# Patient Record
Sex: Female | Born: 1945
Health system: Southern US, Community
[De-identification: ages and names within clinical notes are randomized; demographics above are authoritative.]

## PROBLEM LIST (undated history)

## (undated) DIAGNOSIS — J84112 Idiopathic pulmonary fibrosis: Principal | ICD-10-CM

## (undated) DIAGNOSIS — R053 Chronic cough: Secondary | ICD-10-CM

## (undated) DIAGNOSIS — F329 Major depressive disorder, single episode, unspecified: Secondary | ICD-10-CM

## (undated) DIAGNOSIS — I639 Cerebral infarction, unspecified: Secondary | ICD-10-CM

## (undated) DIAGNOSIS — K219 Gastro-esophageal reflux disease without esophagitis: Secondary | ICD-10-CM

## (undated) DIAGNOSIS — F32A Depression, unspecified: Secondary | ICD-10-CM

## (undated) DIAGNOSIS — F439 Reaction to severe stress, unspecified: Secondary | ICD-10-CM

## (undated) DIAGNOSIS — G459 Transient cerebral ischemic attack, unspecified: Secondary | ICD-10-CM

---

## 2001-04-27 ENCOUNTER — Encounter: Admission: RE | Admit: 2001-04-27 | Discharge: 2001-04-27 | Payer: Self-pay | Admitting: Obstetrics and Gynecology

## 2001-04-27 ENCOUNTER — Encounter: Payer: Self-pay | Admitting: Obstetrics and Gynecology

## 2001-07-02 ENCOUNTER — Encounter (INDEPENDENT_AMBULATORY_CARE_PROVIDER_SITE_OTHER): Payer: Self-pay | Admitting: *Deleted

## 2001-07-02 ENCOUNTER — Ambulatory Visit (HOSPITAL_COMMUNITY): Admission: RE | Admit: 2001-07-02 | Discharge: 2001-07-02 | Payer: Self-pay | Admitting: Gastroenterology

## 2002-05-10 ENCOUNTER — Encounter: Payer: Self-pay | Admitting: Gynecology

## 2002-05-10 ENCOUNTER — Other Ambulatory Visit: Admission: RE | Admit: 2002-05-10 | Discharge: 2002-05-10 | Payer: Self-pay | Admitting: Gynecology

## 2002-05-10 ENCOUNTER — Encounter: Admission: RE | Admit: 2002-05-10 | Discharge: 2002-05-10 | Payer: Self-pay | Admitting: Gynecology

## 2003-05-18 ENCOUNTER — Encounter: Admission: RE | Admit: 2003-05-18 | Discharge: 2003-05-18 | Payer: Self-pay | Admitting: Gynecology

## 2003-05-18 ENCOUNTER — Other Ambulatory Visit: Admission: RE | Admit: 2003-05-18 | Discharge: 2003-05-18 | Payer: Self-pay | Admitting: Gynecology

## 2003-05-18 ENCOUNTER — Encounter: Payer: Self-pay | Admitting: Gynecology

## 2004-05-21 ENCOUNTER — Encounter: Admission: RE | Admit: 2004-05-21 | Discharge: 2004-05-21 | Payer: Self-pay | Admitting: Gynecology

## 2004-05-21 ENCOUNTER — Other Ambulatory Visit: Admission: RE | Admit: 2004-05-21 | Discharge: 2004-05-21 | Payer: Self-pay | Admitting: Gynecology

## 2005-04-04 ENCOUNTER — Encounter: Admission: RE | Admit: 2005-04-04 | Discharge: 2005-04-04 | Payer: Self-pay | Admitting: Family Medicine

## 2005-04-09 ENCOUNTER — Encounter: Admission: RE | Admit: 2005-04-09 | Discharge: 2005-04-09 | Payer: Self-pay | Admitting: Family Medicine

## 2005-05-22 ENCOUNTER — Other Ambulatory Visit: Admission: RE | Admit: 2005-05-22 | Discharge: 2005-05-22 | Payer: Self-pay | Admitting: Gynecology

## 2005-05-22 ENCOUNTER — Encounter: Admission: RE | Admit: 2005-05-22 | Discharge: 2005-05-22 | Payer: Self-pay | Admitting: Gynecology

## 2005-09-04 ENCOUNTER — Encounter (INDEPENDENT_AMBULATORY_CARE_PROVIDER_SITE_OTHER): Payer: Self-pay | Admitting: *Deleted

## 2005-09-04 ENCOUNTER — Ambulatory Visit (HOSPITAL_COMMUNITY): Admission: RE | Admit: 2005-09-04 | Discharge: 2005-09-04 | Payer: Self-pay | Admitting: Gynecology

## 2005-09-04 ENCOUNTER — Ambulatory Visit (HOSPITAL_BASED_OUTPATIENT_CLINIC_OR_DEPARTMENT_OTHER): Admission: RE | Admit: 2005-09-04 | Discharge: 2005-09-04 | Payer: Self-pay | Admitting: Gynecology

## 2006-05-23 ENCOUNTER — Encounter: Admission: RE | Admit: 2006-05-23 | Discharge: 2006-05-23 | Payer: Self-pay | Admitting: Gynecology

## 2007-01-13 ENCOUNTER — Other Ambulatory Visit: Admission: RE | Admit: 2007-01-13 | Discharge: 2007-01-13 | Payer: Self-pay | Admitting: Gynecology

## 2007-05-26 ENCOUNTER — Encounter: Admission: RE | Admit: 2007-05-26 | Discharge: 2007-05-26 | Payer: Self-pay | Admitting: Gynecology

## 2008-05-31 ENCOUNTER — Encounter: Admission: RE | Admit: 2008-05-31 | Discharge: 2008-05-31 | Payer: Self-pay | Admitting: Gynecology

## 2008-06-02 ENCOUNTER — Encounter: Admission: RE | Admit: 2008-06-02 | Discharge: 2008-06-02 | Payer: Self-pay | Admitting: Family Medicine

## 2008-11-25 HISTORY — PX: BREAST EXCISIONAL BIOPSY: SUR124

## 2009-01-03 ENCOUNTER — Encounter: Admission: RE | Admit: 2009-01-03 | Discharge: 2009-01-03 | Payer: Self-pay | Admitting: Family Medicine

## 2009-06-02 ENCOUNTER — Encounter: Admission: RE | Admit: 2009-06-02 | Discharge: 2009-06-02 | Payer: Self-pay | Admitting: Gynecology

## 2009-06-07 ENCOUNTER — Encounter: Admission: RE | Admit: 2009-06-07 | Discharge: 2009-06-07 | Payer: Self-pay | Admitting: Gynecology

## 2009-06-07 ENCOUNTER — Encounter (INDEPENDENT_AMBULATORY_CARE_PROVIDER_SITE_OTHER): Payer: Self-pay | Admitting: Gynecology

## 2009-07-05 ENCOUNTER — Encounter (INDEPENDENT_AMBULATORY_CARE_PROVIDER_SITE_OTHER): Payer: Self-pay | Admitting: Surgery

## 2009-07-06 ENCOUNTER — Ambulatory Visit (HOSPITAL_BASED_OUTPATIENT_CLINIC_OR_DEPARTMENT_OTHER): Admission: RE | Admit: 2009-07-06 | Discharge: 2009-07-06 | Payer: Self-pay | Admitting: Surgery

## 2010-06-08 ENCOUNTER — Encounter: Admission: RE | Admit: 2010-06-08 | Discharge: 2010-06-08 | Payer: Self-pay | Admitting: Gynecology

## 2011-03-03 LAB — BASIC METABOLIC PANEL
BUN: 8 mg/dL (ref 6–23)
Calcium: 9.2 mg/dL (ref 8.4–10.5)
Creatinine, Ser: 0.74 mg/dL (ref 0.4–1.2)
GFR calc Af Amer: >60 mL/min (ref >60)
GFR calc non Af Amer: >60 mL/min (ref >60)
Glucose, Bld: 93 mg/dL (ref 70–99)
Sodium: 139 mEq/L (ref 135–145)

## 2011-04-09 NOTE — Op Note (Signed)
NAMESALENA, ORTLIEB             ACCOUNT NO.:  [id number]   MEDICAL RECORD NO.:  [id number]          PATIENT TYPE:  AMB   LOCATION:  [facility]                          FACILITY:  [facility]   PHYSICIAN:  Currie Paris, M.D.DATE OF BIRTH:  [birth year]   DATE OF PROCEDURE:  07/06/2009  DATE OF DISCHARGE:                               OPERATIVE REPORT   PREOPERATIVE DIAGNOSIS:  Mass, left breast, subareolar.  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   POSTOPERATIVE DIAGNOSIS:  Mass, left breast, subareolar.  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   OPERATION:  Excision of mass, left breast.   SURGEON:  Currie Paris, MD   ANESTHESIA:  MAC.   CLINICAL HISTORY:  This is a 65 year old lady with a palpable small  subareolar mass in the left breast.  A biopsy showed a sclerosing  papilloma.  It was decided to do a complete excision to be sure there  was no underlying malignancy.   DESCRIPTION OF PROCEDURE:  I saw the patient in the holding area and we  confirmed the plans as noted above.  We both initialed the left side as  the operative side.   The patient was taken to the operating room and after satisfactory IV  sedation, the left breast was prepped and draped.  The time-out was  done.   A combination of 1% Xylocaine with epinephrine and 0.5% plain Marcaine  was used for local.  I infiltrated the area of the mass.  I then made a  curvilinear incision at the areolar margin.  I divided a little bit of  fatty tissue just medial to the mass until I thought I was deep to the  mass and then came around superiorly and inferiorly.   The mass in question appeared to be attached to the subareolar tissue,  so I used a cautery and divided off the fat.  I saw least one dilated  duct that looked like it had some bloody fluid in it.  I got the area  completely out.  There was no palpable residual abnormality.   I put more local in.  I made sure everything was dry.  I closed with 3-0  Vicryl, 4-0 Monocryl subcuticular, and Dermabond.   Although the mass was palpable, I went ahead and took the specimen  mammogram and could see that the radiographically placed clip had been  excised, confirming the area that had been biopsied was removed.   The patient tolerated the procedure well and there were no  complications.  All counts were correct.      Currie Paris, M.D.  Electronically Signed     CJS/MEDQ  D:  07/06/2009  T:  07/07/2009  Job:  [id number]   cc:   Gretta Arab. Valentina Lucks, M.D.  [facility]

## 2011-04-12 NOTE — Procedures (Signed)
[place]  Patient:    Ashley Buck, Ashley Buck                    MRN: [id number] Proc. Date: 07/02/01 Adm. Date:  [id number] Attending:  Louie Bun CC:         Esmeralda Arthur, M.D.   Procedure Report  PROCEDURE PERFORMED:  Colonoscopy.  ENDOSCOPIST:  Everardo All. Madilyn Fireman, M.D.  INDICATIONS FOR PROCEDURE:  Recent onset of intermittent small volume hematochezia in a 65 year old patient with no prior colon screening.  DESCRIPTION OF PROCEDURE:  The patient was placed in the left lateral decubitus position and placed on the pulse monitor with continuous low flow oxygen delivered by nasal cannula.  She was sedated with 50 mg IV Demerol and 7.5 mg IV Versed.  The Olympus video colonoscope was inserted into the rectum and advanced to the cecum, confirmed by terminal ileum McBurneys point and visualization of the ileocecal valve and appendiceal orifice.  The prep was excellent.  The cecum, ascending, transverse, descending and sigmoid colon all appeared normal with no masses, polyps, diverticula or other mucosal abnormalities.  The rectum likewise appeared normal and retroflex view of the anus revealed only small nonthrombosed internal hemorrhoids.  The colonoscope was then  withdrawn and the patient returned to the recovery room in stable condition.  The patient tolerated the procedure well and there were no immediate complications.  IMPRESSION:  Small internal hemorrhoids, otherwise normal colonoscopy. DD:  07/02/01 TD:  07/02/01 Job: [id number]

## 2011-04-12 NOTE — Op Note (Signed)
NAME:  Ashley Buck, Ashley Buck             ACCOUNT NO.:  [id number]   MEDICAL RECORD NO.:  [id number]          PATIENT TYPE:  AMB   LOCATION:  [facility]                         FACILITY:  [facility]   PHYSICIAN:  Gretta Cool, M.D. DATE OF BIRTH:  [birth year]   DATE OF PROCEDURE:  09/04/2005  DATE OF DISCHARGE:                                 OPERATIVE REPORT   PREOPERATIVE DIAGNOSES:  Abnormal uterine bleeding with polyp by ultrasound,  portions of polyp on previous endometrial sampling benign tissue.   POSTOPERATIVE DIAGNOSES:  Abnormal uterine bleeding with polyp by  ultrasound, portions of polyp on previous endometrial sampling benign  tissue.   PROCEDURE:  Hysteroscopy, resection total for ablation plus VaporTrode.   SURGEON:  Gretta Cool, M.D.   ANESTHESIA:  IV sedation, paracervical block.   DESCRIPTION OF PROCEDURE:  Under excellent anesthesia as above with the  patient prepped and draped in Allen stirrups, the cervix was pulled down  into view then progressively dilated with a series of Pratt dilators to  accommodate the 7 mm resectoscope. Once the cavity was photographed and  examined, the entire endometrium was resected down 5 mm or more into the  myometrium. The cornual areas were treated by touch technique after  resection superficially. The entire endometrial cavity then was treated by  VaporTrode at 200 watts pure cut so as to eliminate any islands of  superficial endometrium out into the myometrium. At this point, there was no  significant bleeding at reduced pressure. The procedure was terminated  without complications. The patient returned to the recovery room in  excellent condition.           ______________________________  Gretta Cool, M.D.     CWL/MEDQ  D:  09/04/2005  T:  09/04/2005  Job:  [id number]   cc:   Gretta Arab. Valentina Lucks, M.D.  Fax: [phone]

## 2011-05-22 ENCOUNTER — Other Ambulatory Visit: Payer: Self-pay | Admitting: Gynecology

## 2011-05-22 DIAGNOSIS — Z1231 Encounter for screening mammogram for malignant neoplasm of breast: Secondary | ICD-10-CM

## 2011-06-17 ENCOUNTER — Ambulatory Visit
Admission: RE | Admit: 2011-06-17 | Discharge: 2011-06-17 | Disposition: A | Discharge status: Home / Self Care | Payer: Medicare Other | Source: Ambulatory Visit | Attending: Gynecology | Admitting: Gynecology

## 2011-06-17 DIAGNOSIS — Z1231 Encounter for screening mammogram for malignant neoplasm of breast: Secondary | ICD-10-CM

## 2011-07-15 ENCOUNTER — Other Ambulatory Visit: Payer: Self-pay | Admitting: Gynecology

## 2012-07-01 ENCOUNTER — Other Ambulatory Visit: Payer: Self-pay | Admitting: Gynecology

## 2012-07-01 DIAGNOSIS — Z1231 Encounter for screening mammogram for malignant neoplasm of breast: Secondary | ICD-10-CM

## 2012-07-17 ENCOUNTER — Ambulatory Visit
Admission: RE | Admit: 2012-07-17 | Discharge: 2012-07-17 | Disposition: A | Discharge status: Home / Self Care | Payer: Medicare Other | Source: Ambulatory Visit | Attending: Gynecology | Admitting: Gynecology

## 2012-07-17 DIAGNOSIS — Z1231 Encounter for screening mammogram for malignant neoplasm of breast: Secondary | ICD-10-CM

## 2012-07-21 ENCOUNTER — Other Ambulatory Visit: Payer: Self-pay | Admitting: Gynecology

## 2013-02-10 ENCOUNTER — Other Ambulatory Visit: Payer: Self-pay | Admitting: Dermatology

## 2013-06-29 ENCOUNTER — Other Ambulatory Visit: Payer: Self-pay

## 2013-06-29 DIAGNOSIS — Z1231 Encounter for screening mammogram for malignant neoplasm of breast: Secondary | ICD-10-CM

## 2013-07-20 ENCOUNTER — Ambulatory Visit
Admission: RE | Admit: 2013-07-20 | Discharge: 2013-07-20 | Disposition: A | Discharge status: Home / Self Care | Payer: Self-pay | Source: Ambulatory Visit

## 2013-07-20 DIAGNOSIS — Z1231 Encounter for screening mammogram for malignant neoplasm of breast: Secondary | ICD-10-CM

## 2014-02-23 ENCOUNTER — Other Ambulatory Visit: Payer: Self-pay | Admitting: Dermatology

## 2014-08-19 ENCOUNTER — Other Ambulatory Visit: Payer: Self-pay

## 2014-08-19 DIAGNOSIS — Z1231 Encounter for screening mammogram for malignant neoplasm of breast: Secondary | ICD-10-CM

## 2014-09-08 ENCOUNTER — Ambulatory Visit: Payer: Medicare Other

## 2014-09-21 ENCOUNTER — Ambulatory Visit
Admission: RE | Admit: 2014-09-21 | Discharge: 2014-09-21 | Disposition: A | Discharge status: Home / Self Care | Payer: Self-pay | Source: Ambulatory Visit

## 2014-09-21 DIAGNOSIS — Z1231 Encounter for screening mammogram for malignant neoplasm of breast: Secondary | ICD-10-CM

## 2015-10-24 ENCOUNTER — Ambulatory Visit (INDEPENDENT_AMBULATORY_CARE_PROVIDER_SITE_OTHER): Payer: Medicare Other | Admitting: Podiatry

## 2015-10-24 ENCOUNTER — Encounter: Payer: Self-pay | Admitting: Podiatry

## 2015-10-24 VITALS — BP 178/84 | HR 72 | Ht 63.75 in | Wt 168.0 lb

## 2015-10-24 DIAGNOSIS — B351 Tinea unguium: Secondary | ICD-10-CM | POA: Diagnosis not present

## 2015-10-24 DIAGNOSIS — L600 Ingrowing nail: Secondary | ICD-10-CM | POA: Diagnosis not present

## 2015-10-24 DIAGNOSIS — M79606 Pain in leg, unspecified: Secondary | ICD-10-CM | POA: Insufficient documentation

## 2015-10-24 DIAGNOSIS — M79673 Pain in unspecified foot: Secondary | ICD-10-CM | POA: Diagnosis not present

## 2015-10-24 DIAGNOSIS — Q828 Other specified congenital malformations of skin: Secondary | ICD-10-CM

## 2015-10-24 DIAGNOSIS — M79605 Pain in left leg: Secondary | ICD-10-CM

## 2015-10-24 NOTE — Patient Instructions (Signed)
Seen for painful corns, calluses and ingrown nail. All debrided. Return as needed.

## 2015-10-24 NOTE — Progress Notes (Signed)
SUBJECTIVE: 69 y.o. year old female presents for painful corns, calluses, and ingrown toe nails.   REVIEW OF SYSTEMS: Constitutional: negative Eyes: negative Ears, nose, mouth, throat, and face: negative Respiratory: negative Cardiovascular: negative Gastrointestinal: negative Genitourinary:negative Hematologic/lymphatic: negative Neurological: Under care for anxiety problem. Allergic/Immunologic: negative Anxiety problem.  OBJECTIVE: DERMATOLOGIC EXAMINATION: Nails: Painful ingrown nail left great toe medial border without drainage. Thick painful corns at distal lateral and medial aspect of the 5th digits bilateral. Multiple plantar porokeratotic lesions under the balls of both feet.  VASCULAR EXAMINATION OF LOWER LIMBS: Pedal pulses: All pedal pulses are palpable with normal pulsation.  Temperature gradient from tibial crest to dorsum of foot is within normal bilateral. NEUROLOGIC EXAMINATION OF THE LOWER LIMBS: All epicritic and tactile sensations grossly intact. MUSCULOSKELETAL EXAMINATION: Enlarged bone distal end 5th digit bilateral.   ASSESSMENT: Multiple porokeratosis painful bilateral. Ingrown nail left great toe medial border left. Onychomycosis both great toe nails.   PLAN: Reviewed findings. Debrided all nails, corns, and calluses.

## 2015-11-22 ENCOUNTER — Other Ambulatory Visit: Payer: Self-pay

## 2015-11-22 DIAGNOSIS — Z1231 Encounter for screening mammogram for malignant neoplasm of breast: Secondary | ICD-10-CM

## 2015-12-19 ENCOUNTER — Ambulatory Visit
Admission: RE | Admit: 2015-12-19 | Discharge: 2015-12-19 | Disposition: A | Discharge status: Home / Self Care | Payer: Medicare Other | Source: Ambulatory Visit

## 2015-12-19 DIAGNOSIS — Z1231 Encounter for screening mammogram for malignant neoplasm of breast: Secondary | ICD-10-CM

## 2016-11-20 ENCOUNTER — Other Ambulatory Visit: Payer: Self-pay | Admitting: Family Medicine

## 2016-11-20 DIAGNOSIS — Z1231 Encounter for screening mammogram for malignant neoplasm of breast: Secondary | ICD-10-CM

## 2016-12-19 ENCOUNTER — Ambulatory Visit: Payer: Medicare Other

## 2017-01-22 ENCOUNTER — Ambulatory Visit
Admission: RE | Admit: 2017-01-22 | Discharge: 2017-01-22 | Disposition: A | Discharge status: Home / Self Care | Payer: Medicare Other | Source: Ambulatory Visit | Attending: Family Medicine | Admitting: Family Medicine

## 2017-01-22 DIAGNOSIS — Z1231 Encounter for screening mammogram for malignant neoplasm of breast: Secondary | ICD-10-CM

## 2017-02-25 ENCOUNTER — Ambulatory Visit (INDEPENDENT_AMBULATORY_CARE_PROVIDER_SITE_OTHER): Payer: Medicare Other | Admitting: Podiatry

## 2017-02-25 ENCOUNTER — Ambulatory Visit: Payer: Medicare Other

## 2017-02-25 ENCOUNTER — Encounter: Payer: Self-pay | Admitting: Podiatry

## 2017-02-25 ENCOUNTER — Ambulatory Visit (INDEPENDENT_AMBULATORY_CARE_PROVIDER_SITE_OTHER): Payer: Medicare Other

## 2017-02-25 VITALS — BP 156/78 | HR 72 | Resp 16 | Ht 66.0 in | Wt 150.0 lb

## 2017-02-25 DIAGNOSIS — L600 Ingrowing nail: Secondary | ICD-10-CM

## 2017-02-25 DIAGNOSIS — L989 Disorder of the skin and subcutaneous tissue, unspecified: Secondary | ICD-10-CM | POA: Diagnosis not present

## 2017-02-25 DIAGNOSIS — M79671 Pain in right foot: Secondary | ICD-10-CM

## 2017-02-25 DIAGNOSIS — M79672 Pain in left foot: Principal | ICD-10-CM

## 2017-02-25 NOTE — Progress Notes (Signed)
   Subjective:    Patient ID: Ashley Buck, female    DOB: 09/15/1946, 71 y.o.   MRN: [id number]  HPI Chief Complaint  Patient presents with  . Painful lesion    Bilateral; 4th toes-lateral side; x2 yrs      Review of Systems  All other systems reviewed and are negative.      Objective:   Physical Exam        Assessment & Plan:

## 2017-02-25 NOTE — Patient Instructions (Signed)
Pre-Operative Instructions  Congratulations, you have decided to take an important step to improving your quality of life.  You can be assured that the doctors of [facility] will be with you every step of the way.  1. Plan to be at the surgery center/hospital at least 1 (one) hour prior to your scheduled time unless otherwise directed by the surgical center/hospital staff.  You must have a responsible adult accompany you, remain during the surgery and drive you home.  Make sure you have directions to the surgical center/hospital and know how to get there on time. 2. For hospital based surgery you will need to obtain a history and physical form from your family physician within 1 month prior to the date of surgery- we will give you a form for you primary physician.  3. We make every effort to accommodate the date you request for surgery.  There are however, times where surgery dates or times have to be moved.  We will contact you as soon as possible if a change in schedule is required.   4. No Aspirin/Ibuprofen for one week before surgery.  If you are on aspirin, any non-steroidal anti-inflammatory medications (Mobic, Aleve, Ibuprofen) you should stop taking it 7 days prior to your surgery.  You make take Tylenol  For pain prior to surgery.  5. Medications- If you are taking daily heart and blood pressure medications, seizure, reflux, allergy, asthma, anxiety, pain or diabetes medications, make sure the surgery center/hospital is aware before the day of surgery so they may notify you which medications to take or avoid the day of surgery. 6. No food or drink after midnight the night before surgery unless directed otherwise by surgical center/hospital staff. 7. No alcoholic beverages 24 hours prior to surgery.  No smoking 24 hours prior to or 24 hours after surgery. 8. Wear loose pants or shorts- loose enough to fit over bandages, boots, and casts. 9. No slip on shoes, sneakers are best. 10. Bring  your boot with you to the surgery center/hospital.  Also bring crutches or a walker if your physician has prescribed it for you.  If you do not have this equipment, it will be provided for you after surgery. 11. If you have not been contracted by the surgery center/hospital by the day before your surgery, call to confirm the date and time of your surgery. 12. Leave-time from work may vary depending on the type of surgery you have.  Appropriate arrangements should be made prior to surgery with your employer. 13. Prescriptions will be provided immediately following surgery by your doctor.  Have these filled as soon as possible after surgery and take the medication as directed. 14. Remove nail polish on the operative foot. 15. Wash the night before surgery.  The night before surgery wash the foot and leg well with the antibacterial soap provided and water paying special attention to beneath the toenails and in between the toes.  Rinse thoroughly with water and dry well with a towel.  Perform this wash unless told not to do so by your physician.  Enclosed: 1 Ice pack (please put in freezer the night before surgery)   1 Hibiclens skin cleaner   Pre-op Instructions  If you have any questions regarding the instructions, do not hesitate to call our office.  [place]: [address] [place], [place] [address] [phone]  [place]: [address] [place], [place] [address] [phone]  [place]: [address]  [place], [place] [address] [phone]   Dr.   Vivyan Biggers DPM, Dr. Matthew Wagoner DPM, Dr. M. Todd Hyatt DPM, Dr. Titorya Stover DPM 

## 2017-02-26 ENCOUNTER — Telehealth: Payer: Self-pay | Admitting: *Deleted

## 2017-02-26 NOTE — Telephone Encounter (Signed)
"  I'm calling on behalf of Ashley Buck.  She called [place] about an upcoming surgery.  She did not have the codes to provide [place] with so I told her I would call you to get the information.  I need the diagnosis codes and diagnosis codes please."  She is having Hammer Toe Repair 4,5 b/l ([id number])and Exostectomy [phone]).  "Okay, thank you so much."

## 2017-03-03 NOTE — Progress Notes (Signed)
Subjective:     Patient ID: Ashley Buck, female   DOB: 09-02-1946, 71 y.o.   MRN: [id number]  HPI patient presents stating that she has a lot of pain in her fifth toes of both feet fourth toes of both feet and on the inner side of the fifth toes of both feet. Patient states that this is been going on a long time and that she's tried to trim them she's tried to pad them and she's tried other modalities without relief of symptoms and they gradually becoming more discomforting and she cannot wear any form shoe gear comfortably. States it's been present for around a year   Review of Systems  All other systems reviewed and are negative.      Objective:   Physical Exam  Constitutional: She is oriented to person, place, and time.  Cardiovascular: Intact distal pulses.   Musculoskeletal: Normal range of motion.  Neurological: She is oriented to person, place, and time.  Skin: Skin is warm.  Nursing note and vitals reviewed.  neurovascular status intact muscle strength adequate range of motion within normal limits with patient found to have significant distal rotation digit 5 bilateral with distal lateral keratotic lesions that are very painful when pressed and keratotic lesions on the head of proximal hallux fourth toe both feet that are sore. Patient does have upon gait a significant rotational deformity of the fifth digit bilateral     Assessment:     Chronic lesion secondary to foot structure and digital structure with distal lateral keratotic lesions fifth digit bilateral    Plan:     H&P and conditions reviewed. At this point I do think it would be best due to her failure to respond to previous treatments of trimming padding and shoe gear modifications to consider surgical intervention. I reviewed that with her and this is what she wants and at this time I allowed her to read consent form going over distal arthroplasties distal lateral exostectomy and arthroplasty digit for both feet.  I reviewed alternative treatments complications and patient wants surgery and after extensive review signs consent form understanding no guarantee that this will get rid of the lesions permanently and that all complications are possible. She understands recovery can be 6 months to one year and signs consent form and is encouraged to call with any questions prior to surgery  X-rays indicate that there is significant rotation of the fifth digit bilateral pressing against the fourth toe bilateral with keratotic tissue formation

## 2017-03-18 ENCOUNTER — Encounter: Payer: Self-pay | Admitting: Podiatry

## 2017-03-18 DIAGNOSIS — M2042 Other hammer toe(s) (acquired), left foot: Secondary | ICD-10-CM | POA: Diagnosis not present

## 2017-03-18 DIAGNOSIS — M2041 Other hammer toe(s) (acquired), right foot: Secondary | ICD-10-CM

## 2017-03-18 DIAGNOSIS — M25774 Osteophyte, right foot: Secondary | ICD-10-CM | POA: Diagnosis not present

## 2017-03-18 DIAGNOSIS — M25775 Osteophyte, left foot: Secondary | ICD-10-CM

## 2017-03-26 ENCOUNTER — Ambulatory Visit (INDEPENDENT_AMBULATORY_CARE_PROVIDER_SITE_OTHER): Payer: Medicare Other

## 2017-03-26 ENCOUNTER — Encounter: Payer: Self-pay | Admitting: Podiatry

## 2017-03-26 ENCOUNTER — Ambulatory Visit (INDEPENDENT_AMBULATORY_CARE_PROVIDER_SITE_OTHER): Payer: Self-pay | Admitting: Podiatry

## 2017-03-26 VITALS — Resp 16

## 2017-03-26 DIAGNOSIS — M2042 Other hammer toe(s) (acquired), left foot: Secondary | ICD-10-CM

## 2017-03-26 DIAGNOSIS — M2041 Other hammer toe(s) (acquired), right foot: Secondary | ICD-10-CM

## 2017-03-26 NOTE — Progress Notes (Signed)
Subjective:    Patient ID: Ashley Buck, female   DOB: 71 y.o.   MRN: [id number]   HPI patient states she's doing well with toes in good alignment and states she's having minimal discomfort    ROS      Objective:  Physical Exam Neurovascular status intact negative Homans sign was noted with fourth and fifth digits good alignment wound edges well coapted with no drainage or other pathology noted and mild swelling    Assessment:  Doing well post arthroplasty digit 5 bilateral digits 4 bilateral and inner side digit 5 bilateral       Plan:     H&P conditions reviewed and reapplied dressings to the toes advised on wider shoes to be continued and Darco shoes and continued reduced activity elevation. Reappoint for [place] to recheck   X-rays revealed satisfactory resection of bone fourth and fifth digits with good alignment and patient will be seen back 2 weeks suture removal or earlier if any issues should occur

## 2017-04-03 NOTE — Progress Notes (Signed)
derotational arterioplasty 5th toe both feet: arthroplasty 4th toe both feet: exostectomy 5th toe both feet

## 2017-04-08 ENCOUNTER — Ambulatory Visit (INDEPENDENT_AMBULATORY_CARE_PROVIDER_SITE_OTHER): Payer: Self-pay | Admitting: Podiatry

## 2017-04-08 DIAGNOSIS — M2041 Other hammer toe(s) (acquired), right foot: Secondary | ICD-10-CM

## 2017-04-08 DIAGNOSIS — M2042 Other hammer toe(s) (acquired), left foot: Secondary | ICD-10-CM

## 2017-04-09 ENCOUNTER — Encounter: Payer: Medicare Other | Admitting: Podiatry

## 2017-04-15 NOTE — Progress Notes (Signed)
Patient presents status post derotational arterioplasty 5th toe both feet: arthroplasty 4th toe both feet: exostectomy 5th toe both feet stating that her feet are feeling good and she is no in a lot of pain   Surgical sites appear to be healing well, incision area healing well, aligned and approximated. No redness or drainage noted. Mild swelling but WNL limits  Sutures were removed and areas remained intact. Advised of s/s of infection. She is to follow up in 2 weeks or sooner if problem arise

## 2017-04-23 ENCOUNTER — Ambulatory Visit (INDEPENDENT_AMBULATORY_CARE_PROVIDER_SITE_OTHER): Payer: Medicare Other

## 2017-04-23 ENCOUNTER — Ambulatory Visit (INDEPENDENT_AMBULATORY_CARE_PROVIDER_SITE_OTHER): Payer: Self-pay | Admitting: Podiatry

## 2017-04-23 DIAGNOSIS — M2042 Other hammer toe(s) (acquired), left foot: Secondary | ICD-10-CM

## 2017-04-23 DIAGNOSIS — M2041 Other hammer toe(s) (acquired), right foot: Secondary | ICD-10-CM

## 2017-04-23 NOTE — Progress Notes (Signed)
Subjective:    Patient ID: Elliot CousinJeanette S Seelinger, female   DOB: 71 y.o.   MRN: [id number]   HPI patient states she's doing very well with surgery    ROS      Objective:  Physical Exam Neurovascular status intact negative Homan sign was noted with patient found to have good digital position digits 45 of both feet and outside of fifth digit both feet    Assessment:    Doing well with digital surgery for an 5 bilateral with exostectomy     Plan:    Stitches removed wound edges well coapted and advised on gradual return to soft shoes and increased activity  X-rays indicate satisfactory section of bone with good alignment of the fourth and fifth toes noted

## 2018-01-02 ENCOUNTER — Other Ambulatory Visit: Payer: Self-pay | Admitting: Family Medicine

## 2018-01-02 DIAGNOSIS — Z1231 Encounter for screening mammogram for malignant neoplasm of breast: Secondary | ICD-10-CM

## 2018-01-23 ENCOUNTER — Ambulatory Visit: Payer: Medicare Other

## 2018-01-29 ENCOUNTER — Ambulatory Visit
Admission: RE | Admit: 2018-01-29 | Discharge: 2018-01-29 | Disposition: A | Discharge status: Home / Self Care | Payer: Medicare Other | Source: Ambulatory Visit | Attending: Family Medicine | Admitting: Family Medicine

## 2018-01-29 DIAGNOSIS — Z1231 Encounter for screening mammogram for malignant neoplasm of breast: Secondary | ICD-10-CM

## 2018-09-20 ENCOUNTER — Emergency Department (HOSPITAL_COMMUNITY): Payer: Medicare Other

## 2018-09-20 ENCOUNTER — Observation Stay (HOSPITAL_COMMUNITY)
Admission: EM | Admit: 2018-09-20 | Discharge: 2018-09-21 | Disposition: A | Discharge status: Home / Self Care | Payer: Medicare Other | Attending: Internal Medicine | Admitting: Internal Medicine

## 2018-09-20 ENCOUNTER — Other Ambulatory Visit: Payer: Self-pay

## 2018-09-20 ENCOUNTER — Observation Stay (HOSPITAL_COMMUNITY): Payer: Medicare Other

## 2018-09-20 ENCOUNTER — Encounter (HOSPITAL_COMMUNITY): Payer: Self-pay

## 2018-09-20 DIAGNOSIS — Z7902 Long term (current) use of antithrombotics/antiplatelets: Secondary | ICD-10-CM | POA: Insufficient documentation

## 2018-09-20 DIAGNOSIS — Z823 Family history of stroke: Secondary | ICD-10-CM | POA: Insufficient documentation

## 2018-09-20 DIAGNOSIS — I10 Essential (primary) hypertension: Secondary | ICD-10-CM | POA: Diagnosis not present

## 2018-09-20 DIAGNOSIS — E871 Hypo-osmolality and hyponatremia: Secondary | ICD-10-CM

## 2018-09-20 DIAGNOSIS — Z8249 Family history of ischemic heart disease and other diseases of the circulatory system: Secondary | ICD-10-CM | POA: Insufficient documentation

## 2018-09-20 DIAGNOSIS — Z7982 Long term (current) use of aspirin: Secondary | ICD-10-CM | POA: Diagnosis not present

## 2018-09-20 DIAGNOSIS — F329 Major depressive disorder, single episode, unspecified: Secondary | ICD-10-CM | POA: Diagnosis not present

## 2018-09-20 DIAGNOSIS — Z87891 Personal history of nicotine dependence: Secondary | ICD-10-CM | POA: Insufficient documentation

## 2018-09-20 DIAGNOSIS — F419 Anxiety disorder, unspecified: Secondary | ICD-10-CM

## 2018-09-20 DIAGNOSIS — G459 Transient cerebral ischemic attack, unspecified: Principal | ICD-10-CM | POA: Diagnosis present

## 2018-09-20 DIAGNOSIS — G20 Parkinson's disease: Secondary | ICD-10-CM | POA: Insufficient documentation

## 2018-09-20 DIAGNOSIS — K219 Gastro-esophageal reflux disease without esophagitis: Secondary | ICD-10-CM | POA: Insufficient documentation

## 2018-09-20 DIAGNOSIS — I160 Hypertensive urgency: Secondary | ICD-10-CM | POA: Diagnosis not present

## 2018-09-20 HISTORY — DX: Depression, unspecified: F32.A

## 2018-09-20 HISTORY — DX: Reaction to severe stress, unspecified: F43.9

## 2018-09-20 HISTORY — DX: Major depressive disorder, single episode, unspecified: F32.9

## 2018-09-20 HISTORY — DX: Gastro-esophageal reflux disease without esophagitis: K21.9

## 2018-09-20 LAB — URINALYSIS, ROUTINE W REFLEX MICROSCOPIC
Bacteria, UA: NONE SEEN
Bilirubin Urine: NEGATIVE
GLUCOSE, UA: NEGATIVE mg/dL
Ketones, ur: NEGATIVE mg/dL
Leukocytes, UA: NEGATIVE
Nitrite: NEGATIVE
PH: 7 (ref 5.0–8.0)
Protein, ur: NEGATIVE mg/dL
SPECIFIC GRAVITY, URINE: 1.006 (ref 1.005–1.030)

## 2018-09-20 LAB — COMPREHENSIVE METABOLIC PANEL
ALK PHOS: 40 U/L (ref 38–126)
ALT: 13 U/L (ref 0–44)
ANION GAP: 11 (ref 5–15)
AST: 23 U/L (ref 15–41)
Albumin: 4.4 g/dL (ref 3.5–5.0)
BILIRUBIN TOTAL: 0.4 mg/dL (ref 0.3–1.2)
BUN: 15 mg/dL (ref 8–23)
CALCIUM: 8.8 mg/dL — AB (ref 8.9–10.3)
CO2: 24 mmol/L (ref 22–32)
Chloride: 98 mmol/L (ref 98–111)
Creatinine, Ser: 0.79 mg/dL (ref 0.44–1.00)
GLUCOSE: 87 mg/dL (ref 70–99)
POTASSIUM: 3.5 mmol/L (ref 3.5–5.1)
Sodium: 133 mmol/L — ABNORMAL LOW (ref 135–145)
TOTAL PROTEIN: 7.4 g/dL (ref 6.5–8.1)

## 2018-09-20 LAB — CBC
HEMATOCRIT: 38.6 % (ref 36.0–46.0)
Hemoglobin: 12.9 g/dL (ref 12.0–15.0)
MCH: 28.5 pg (ref 26.0–34.0)
MCHC: 33.4 g/dL (ref 30.0–36.0)
MCV: 85.2 fL (ref 80.0–100.0)
PLATELETS: 156 10*3/uL (ref 150–400)
RBC: 4.53 MIL/uL (ref 3.87–5.11)
RDW: 13 % (ref 11.5–15.5)
WBC: 5 10*3/uL (ref 4.0–10.5)
nRBC: 0 % (ref 0.0–0.2)

## 2018-09-20 LAB — PROTIME-INR
INR: 0.88
PROTHROMBIN TIME: 11.8 s (ref 11.4–15.2)

## 2018-09-20 LAB — I-STAT CHEM 8, ED
BUN: 17 mg/dL (ref 8–23)
CALCIUM ION: 1.08 mmol/L — AB (ref 1.15–1.40)
Chloride: 97 mmol/L — ABNORMAL LOW (ref 98–111)
Creatinine, Ser: 0.8 mg/dL (ref 0.44–1.00)
GLUCOSE: 85 mg/dL (ref 70–99)
HCT: 38 % (ref 36.0–46.0)
HEMOGLOBIN: 12.9 g/dL (ref 12.0–15.0)
POTASSIUM: 3.5 mmol/L (ref 3.5–5.1)
SODIUM: 132 mmol/L — AB (ref 135–145)
TCO2: 27 mmol/L (ref 22–32)

## 2018-09-20 LAB — DIFFERENTIAL
Abs Immature Granulocytes: 0.02 10*3/uL (ref 0.00–0.07)
Basophils Absolute: 0 10*3/uL (ref 0.0–0.1)
Basophils Relative: 1 %
EOS ABS: 0.1 10*3/uL (ref 0.0–0.5)
Eosinophils Relative: 1 %
Immature Granulocytes: 0 %
LYMPHS ABS: 1.5 10*3/uL (ref 0.7–4.0)
LYMPHS PCT: 30 %
MONO ABS: 0.5 10*3/uL (ref 0.1–1.0)
MONOS PCT: 10 %
Neutro Abs: 2.9 10*3/uL (ref 1.7–7.7)
Neutrophils Relative %: 58 %

## 2018-09-20 LAB — I-STAT TROPONIN, ED: TROPONIN I, POC: 0 ng/mL (ref 0.00–0.08)

## 2018-09-20 LAB — APTT: aPTT: 32 seconds (ref 24–36)

## 2018-09-20 MED ORDER — PANTOPRAZOLE SODIUM 40 MG PO TBEC
40.0000 mg | DELAYED_RELEASE_TABLET | Freq: Every day | ORAL | Status: DC
  Administered 2018-09-21: 40 mg via ORAL
  Filled 2018-09-20: qty 1

## 2018-09-20 MED ORDER — ALPRAZOLAM 0.25 MG PO TABS
0.2500 mg | ORAL_TABLET | Freq: Three times a day (TID) | ORAL | Status: DC | PRN
  Administered 2018-09-21: 0.25 mg via ORAL
  Filled 2018-09-20: qty 1

## 2018-09-20 MED ORDER — ACETAMINOPHEN 325 MG PO TABS: 650.0000 mg | ORAL_TABLET | ORAL | Status: DC | PRN

## 2018-09-20 MED ORDER — HYDROCODONE-ACETAMINOPHEN 10-325 MG PO TABS: 1.0000 | ORAL_TABLET | Freq: Four times a day (QID) | ORAL | Status: DC | PRN

## 2018-09-20 MED ORDER — SENNOSIDES-DOCUSATE SODIUM 8.6-50 MG PO TABS: 1.0000 | ORAL_TABLET | Freq: Every evening | ORAL | Status: DC | PRN

## 2018-09-20 MED ORDER — HYDRALAZINE HCL 20 MG/ML IJ SOLN
5.0000 mg | INTRAMUSCULAR | Status: DC | PRN
  Administered 2018-09-20: 5 mg via INTRAVENOUS
  Filled 2018-09-20: qty 1

## 2018-09-20 MED ORDER — QUINAPRIL-HYDROCHLOROTHIAZIDE 20-25 MG PO TABS: 1.0000 | ORAL_TABLET | Freq: Every day | ORAL | Status: DC

## 2018-09-20 MED ORDER — ENOXAPARIN SODIUM 40 MG/0.4ML ~~LOC~~ SOLN
40.0000 mg | Freq: Every day | SUBCUTANEOUS | Status: DC
  Administered 2018-09-21: 40 mg via SUBCUTANEOUS
  Filled 2018-09-20: qty 0.4

## 2018-09-20 MED ORDER — STROKE: EARLY STAGES OF RECOVERY BOOK
Freq: Once | Status: AC
  Administered 2018-09-21: 11:00:00
  Filled 2018-09-20: qty 1

## 2018-09-20 MED ORDER — HYDRALAZINE HCL 25 MG PO TABS
25.0000 mg | ORAL_TABLET | Freq: Two times a day (BID) | ORAL | Status: DC
  Administered 2018-09-21 (×2): 25 mg via ORAL
  Filled 2018-09-20 (×4): qty 1

## 2018-09-20 MED ORDER — ACETAMINOPHEN 650 MG RE SUPP: 650.0000 mg | RECTAL | Status: DC | PRN

## 2018-09-20 MED ORDER — LABETALOL HCL 5 MG/ML IV SOLN: 10.0000 mg | Freq: Once | INTRAVENOUS | Status: DC

## 2018-09-20 MED ORDER — ACETAMINOPHEN 160 MG/5ML PO SOLN: 650.0000 mg | ORAL | Status: DC | PRN

## 2018-09-20 NOTE — ED Provider Notes (Signed)
[facility]-EMERGENCY DEPT Provider Note   CSN: [id number] Arrival date & time: 09/20/18  1819     History   Chief Complaint Chief Complaint  Patient presents with  . Numbness    HPI Ashley Buck is a 72 y.o. female.  Ashley Buck is a 72 y.o. Female with a history of hypertension and anxiety, who presents to the emergency department for evaluation of tingling in her face and left hand.  She reports 2 episodes last night and to an additional episodes today where she had a sensation of heaviness in her head and then developed numbness and tingling in her left hand, the left side of her face, her lips and tongue.  She reports she felt like she had difficulty getting words out.  In one of the episode she also had some numbness and tingling in her left leg.  Each time after she sat down and rested symptoms of resolved within 15 minutes.  Most recent episode occurred about an hour prior to arrival.  The all symptoms have resolved at this point.  She denies any associated vision changes or dizziness.  No history of similar symptoms in the past, no history of headaches.  She denies any associated chest pain or shortness of breath, no abdominal pain, nausea or vomiting.  Patient takes her blood pressure medication regularly and took it this morning, but is noted to be hypertensive on arrival.      History reviewed. No pertinent past medical history.  Patient Active Problem List   Diagnosis Date Noted  . TIA (transient ischemic attack) 09/20/2018  . Porokeratosis 10/24/2015  . Ingrown nail 10/24/2015  . Pain in lower limb 10/24/2015  . Onychomycosis 10/24/2015    Past Surgical History:  Procedure Laterality Date  . BREAST EXCISIONAL BIOPSY Left 2010   benign     OB History   None      Home Medications    Prior to Admission medications   Medication Sig Start Date End Date Taking? Authorizing Provider  ibuprofen (ADVIL,MOTRIN) 200 MG tablet Take  400 mg by mouth every 6 (six) hours as needed for mild pain or moderate pain.   Yes [provider]  metroNIDAZOLE (METROCREAM) 0.75 % cream Apply 1 application topically 2 (two) times daily as needed (irritation).  07/22/18  Yes [provider]  quinapril-hydrochlorothiazide (ACCURETIC) 20-25 MG tablet Take 1 tablet by mouth daily after breakfast.  08/24/15  Yes [provider]  raNITIdine HCl (ACID REDUCER PO) Take 1 tablet by mouth daily.   Yes [provider]  venlafaxine XR (EFFEXOR-XR) 37.5 MG 24 hr capsule 37.5 mg daily with breakfast.  09/07/15  Yes [provider]    Family History History reviewed. No pertinent family history.  Social History Social History   Tobacco Use  . Smoking status: Former Games developer  . Smokeless tobacco: Never Used  Substance Use Topics  . Alcohol use: Not on file  . Drug use: Not on file     Allergies   Potassium-containing compounds and Sulfur   Review of Systems Review of Systems  Constitutional: Negative for chills and fever.  HENT: Negative.   Eyes: Negative for pain and visual disturbance.  Respiratory: Negative for cough and shortness of breath.   Cardiovascular: Negative for chest pain and leg swelling.  Gastrointestinal: Negative for abdominal pain, nausea and vomiting.  Genitourinary: Negative for dysuria and frequency.  Musculoskeletal: Negative for arthralgias and myalgias.  Skin: Negative for color change  and rash.  Neurological: Positive for speech difficulty and numbness. Negative for dizziness, seizures, syncope, facial asymmetry, weakness, light-headedness and headaches.       Paresthesias     Physical Exam Updated Vital Signs BP (!) 209/92   Temp 97.7 F (36.5 C) (Oral)   Resp 18   Ht 5\' 6"  (1.676 m)   Wt 65.8 kg   SpO2 98%   BMI 23.40 kg/m   Physical Exam  Constitutional: She is oriented to person, place, and time. She appears well-developed and well-nourished. No  distress.  HENT:  Head: Normocephalic and atraumatic.  Mouth/Throat: Oropharynx is clear and moist.  Eyes: Pupils are equal, round, and reactive to light. EOM are normal. Right eye exhibits no discharge. Left eye exhibits no discharge.  Neck: Normal range of motion. Neck supple.  Cardiovascular: Normal rate, regular rhythm, normal heart sounds and intact distal pulses. Exam reveals no gallop and no friction rub.  No murmur heard. Pulmonary/Chest: Effort normal and breath sounds normal. No respiratory distress.  Respirations equal and unlabored, patient able to speak in full sentences, lungs clear to auscultation bilaterally  Abdominal: Soft. Bowel sounds are normal. She exhibits no distension and no mass. There is no tenderness. There is no guarding.  Abdomen soft, nondistended, nontender to palpation in all quadrants without guarding or peritoneal signs  Musculoskeletal: She exhibits no deformity.  Neurological: She is alert and oriented to person, place, and time. Coordination normal.  Speech is clear, able to follow commands CN III-XII intact Normal strength in upper and lower extremities bilaterally including dorsiflexion and plantar flexion, strong and equal grip strength Sensation normal to light and sharp touch Moves extremities without ataxia, coordination intact Normal finger to nose and rapid alternating movements No pronator drift  Skin: Skin is warm and dry. Capillary refill takes less than 2 seconds. She is not diaphoretic.  Psychiatric: She has a normal mood and affect. Her behavior is normal.  Nursing note and vitals reviewed.    ED Treatments / Results  Labs (all labs ordered are listed, but only abnormal results are displayed) Labs Reviewed  COMPREHENSIVE METABOLIC PANEL - Abnormal; Notable for the following components:      Result Value   Sodium 133 (*)    Calcium 8.8 (*)    All other components within normal limits  URINALYSIS, ROUTINE W REFLEX MICROSCOPIC -  Abnormal; Notable for the following components:   Color, Urine STRAW (*)    Hgb urine dipstick SMALL (*)    All other components within normal limits  I-STAT CHEM 8, ED - Abnormal; Notable for the following components:   Sodium 132 (*)    Chloride 97 (*)    Calcium, Ion 1.08 (*)    All other components within normal limits  PROTIME-INR  APTT  CBC  DIFFERENTIAL  HEMOGLOBIN A1C  LIPID PANEL  I-STAT TROPONIN, ED    EKG EKG Interpretation  Date/Time:  Sunday September 20 2018 22:14:37 EDT Ventricular Rate:  73 PR Interval:    QRS Duration: 103 QT Interval:  430 QTC Calculation: 474 R Axis:   80 Text Interpretation:  Sinus rhythm Probable anteroseptal infarct, old no change from previous Confirmed by Arby Barrette [phone]) on 09/21/2018 12:25:55 AM   Radiology Dg Chest 2 View  Result Date: 09/20/2018 CLINICAL DATA:  Tingling and numbness left base since last night. EXAM: CHEST - 2 VIEW COMPARISON:  04/04/2005 FINDINGS: Lungs are adequately inflated without focal airspace consolidation or effusion. Cardiomediastinal silhouette, bones and soft  tissues are unchanged. IMPRESSION: No active cardiopulmonary disease. Electronically Signed   By: Elberta Fortis M.D.   On: 09/20/2018 23:38   Ct Head Wo Contrast  Result Date: 09/20/2018 CLINICAL DATA:  Tingling and numbness of the face lips and left hand last evening and again this afternoon. EXAM: CT HEAD WITHOUT CONTRAST TECHNIQUE: Contiguous axial images were obtained from the base of the skull through the vertex without intravenous contrast. COMPARISON:  None. FINDINGS: Brain: Age-indeterminate small vessel ischemic disease of periventricular white matter. No hydrocephalus, intra-axial mass nor extra-axial fluid collections. No hemorrhage, large vascular territory infarct, midline shift or edema. Midline fourth ventricle and basal cisterns. Vascular: No hyperdense vessel sign. Atherosclerosis of the carotid siphons. Skull: Negative  Sinuses/Orbits: Nonacute Other: None IMPRESSION: Age-indeterminate small vessel ischemic disease of periventricular white matter. Electronically Signed   By: Tollie Eth M.D.   On: 09/20/2018 19:38    Procedures Procedures (including critical care time)  Medications Ordered in ED Medications  HYDROcodone-acetaminophen (NORCO) 10-325 MG per tablet 1 tablet (has no administration in time range)  pantoprazole (PROTONIX) EC tablet 40 mg (has no administration in time range)   stroke: mapping our early stages of recovery book (has no administration in time range)  acetaminophen (TYLENOL) tablet 650 mg (has no administration in time range)    Or  acetaminophen (TYLENOL) solution 650 mg (has no administration in time range)    Or  acetaminophen (TYLENOL) suppository 650 mg (has no administration in time range)  senna-docusate (Senokot-S) tablet 1 tablet (has no administration in time range)  enoxaparin (LOVENOX) injection 40 mg (has no administration in time range)  hydrALAZINE (APRESOLINE) tablet 25 mg (has no administration in time range)  hydrALAZINE (APRESOLINE) injection 5 mg (5 mg Intravenous Given 09/20/18 2234)  ALPRAZolam (XANAX) tablet 0.25 mg (has no administration in time range)  lisinopril (PRINIVIL,ZESTRIL) tablet 20 mg (has no administration in time range)  hydrochlorothiazide (HYDRODIURIL) tablet 25 mg (has no administration in time range)     Initial Impression / Assessment and Plan / ED Course  I have reviewed the triage vital signs and the nursing notes.  Pertinent labs & imaging results that were available during my care of the patient were reviewed by me and considered in my medical decision making (see chart for details).  Patient presents to the emergency department for multiple episodes over the past 2 days where patient had sensation of head heaviness with associated numbness and tingling in her left hand and arm, the left side of the face and some speech difficulty.   No prior history of stroke.  At the time of arrival patient has no headache or head heaviness and neurologic exam is completely normal.  She is noted to be hypertensive but all other vitals are unremarkable and patient appears to be in no acute distress.  Presentation is concerning for TIA versus hypertensive emergency, will get CT head, EKG and labs.  CT head shows some microvascular changes but no evidences of acute intracranial abnormality.  EKG without concerning changes and troponin negative.  Labs overall unremarkable, no leukocytosis, normal hemoglobin, mild hyponatremia of 133, no other acute electrolyte derangements requiring intervention, normal renal and liver function, urinalysis without any signs of infection, normal coags.  Patient has had no recurrence of symptoms while here but given her history of hypertension and multiple episodes of symptoms concerning for TIA, case discussed with Dr. Wilford Corner with neurology who recommends hospitalist admission and transfer to [facility] with MRI, recommends holding  off on treating blood pressures unless systolic greater than 220 until MRI is obtained to rule out stroke.  Will consult hospitalist for admission.  Case discussed with Dr. Sharyon Medicus with [facility] hospitalist who will see and admit the patient.  Final Clinical Impressions(s) / ED Diagnoses   Final diagnoses:  TIA (transient ischemic attack)  Hypertension, unspecified type    ED Discharge Orders    None       Dartha Lodge, [place] 09/21/18 1610    Arby Barrette, MD 10/01/18 1442

## 2018-09-20 NOTE — H&P (Addendum)
[facility]                                                                                    Patient Demographics  Ashley Buck, is a 72 y.o. female  CSN: [id number]  MRN: [id number]  DOB - 01/21/1946  Admit Date - 09/20/2018  Outpatient Primary MD for the patient is Maurice Small, MD   With History of -  History reviewed. No pertinent past medical history.    Past Surgical History:  Procedure Laterality Date  . BREAST EXCISIONAL BIOPSY Left 2010   benign    in for   Chief Complaint  Patient presents with  . Numbness     HPI  Ashley Buck  is a 72 y.o. female, past medical history significant for hypertension, anxiety presenting with 2 days history of focal symptoms with headache associated with left-sided weakness,and slurring of speech.  The first episode happened yesterday and continued for 10 minutes and she had 3 more episodes today.  The patient forgot to take her blood pressure medication early in a.m. yesterday but she took it in the afternoon.  Patient is compliant with her medications and she feels that there is an element of anxiety.  Patient has a history of anxiety/depression on venlafaxine.  The case was discussed with neurology on the phone who advised MRI/MRA of the brain and asked for the patient to be transferred to [facility] for neurology consult.    Review of Systems    In addition to the HPI above,  No Fever-chills, No problems swallowing food or Liquids, No Chest pain, Cough or Shortness of Breath, No Abdominal pain, No Nausea or Vommitting, Bowel movements are regular, No Blood in stool or Urine, No dysuria, No new skin rashes or bruises, No new joints pains-aches,  No recent weight gain or Buck, No polyuria, polydypsia or polyphagia, No significant Mental Stressors.  A full 10 point Review of Systems was done, except as stated above, all other Review of Systems were negative.   Social History Social History    Tobacco Use  . Smoking status: Former Games developer  . Smokeless tobacco: Never Used  Substance Use Topics  . Alcohol use: Not on file     Family History Significant for coronary artery disease and CVA in her brothers and mother  Prior to Admission medications   Medication Sig Start Date End Date Taking? Authorizing Provider  quinapril-hydrochlorothiazide (ACCURETIC) 20-25 MG tablet Take 1 tablet by mouth daily after breakfast.  08/24/15  Yes [provider]  venlafaxine XR (EFFEXOR-XR) 37.5 MG 24 hr capsule 37.5 mg daily with breakfast.  09/07/15  Yes [provider]  metroNIDAZOLE (METROCREAM) 0.75 % cream Apply 1 application topically 2 (two) times daily as needed. 07/22/18   [provider]    Allergies  Allergen Reactions  . Potassium-Containing Compounds   . Sulfur     Physical Exam  Vitals  Blood pressure (!) 196/97, pulse 67, temperature 97.7 F (36.5 C), temperature source Oral, resp. rate (!) 23, height 5\' 6"  (1.676 m), weight 65.8 kg, SpO2 99 %.   1. General anxious female, in no acute distress  2.  Normal affect and insight, Not Suicidal or Homicidal, Awake Alert, Oriented X 3.  3. No F.N deficits, ALL C.Nerves Intact, Strength 5/5 all 4 extremities, Sensation intact all 4 extremities,  Ears and Eyes appear Normal, Conjunctivae clear, PERRLA. Moist Oral Mucosa.  5. Supple Neck, No JVD, No cervical lymphadenopathy appriciated, No Carotid Bruits.  6. Symmetrical Chest wall movement, Good air movement bilaterally, CTAB.  7. RRR, No Gallops, Rubs or Murmurs, No Parasternal Heave.  8. Positive Bowel Sounds, Abdomen Soft, Non tender, No organomegaly appriciated,No rebound -guarding or rigidity.  9.  No Cyanosis, Normal Skin Turgor, No Skin Rash or Bruise.  10. Good muscle tone,  joints appear normal , no effusions, Normal ROM.    Data Review  CBC Recent Labs  Lab 09/20/18 1921 09/20/18 1929  WBC 5.0  --   HGB 12.9 12.9  HCT  38.6 38.0  PLT 156  --   MCV 85.2  --   MCH 28.5  --   MCHC 33.4  --   RDW 13.0  --   LYMPHSABS 1.5  --   MONOABS 0.5  --   EOSABS 0.1  --   BASOSABS 0.0  --    ------------------------------------------------------------------------------------------------------------------  Chemistries  Recent Labs  Lab 09/20/18 1921 09/20/18 1929  NA 133* 132*  K 3.5 3.5  CL 98 97*  CO2 24  --   GLUCOSE 87 85  BUN 15 17  CREATININE 0.79 0.80  CALCIUM 8.8*  --   AST 23  --   ALT 13  --   ALKPHOS 40  --   BILITOT 0.4  --    ------------------------------------------------------------------------------------------------------------------ estimated creatinine clearance is 57.8 mL/min (by C-G formula based on SCr of 0.8 mg/dL). ------------------------------------------------------------------------------------------------------------------ No results for input(s): TSH, T4TOTAL, T3FREE, THYROIDAB in the last 72 hours.  Invalid input(s): FREET3   Coagulation profile Recent Labs  Lab 09/20/18 1921  INR 0.88   ------------------------------------------------------------------------------------------------------------------- No results for input(s): DDIMER in the last 72 hours. -------------------------------------------------------------------------------------------------------------------  Cardiac Enzymes No results for input(s): CKMB, TROPONINI, MYOGLOBIN in the last 168 hours.  Invalid input(s): CK ------------------------------------------------------------------------------------------------------------------ Invalid input(s): POCBNP   ---------------------------------------------------------------------------------------------------------------  Urinalysis    Component Value Date/Time   COLORURINE STRAW (A) 09/20/2018 2007   APPEARANCEUR CLEAR 09/20/2018 2007   LABSPEC 1.006 09/20/2018 2007   PHURINE 7.0 09/20/2018 2007   GLUCOSEU NEGATIVE 09/20/2018 2007   HGBUR  SMALL (A) 09/20/2018 2007   BILIRUBINUR NEGATIVE 09/20/2018 2007   KETONESUR NEGATIVE 09/20/2018 2007   PROTEINUR NEGATIVE 09/20/2018 2007   NITRITE NEGATIVE 09/20/2018 2007   LEUKOCYTESUR NEGATIVE 09/20/2018 2007    ----------------------------------------------------------------------------------------------------------------   Imaging results:   Ct Head Wo Contrast  Result Date: 09/20/2018 CLINICAL DATA:  Tingling and numbness of the face lips and left hand last evening and again this afternoon. EXAM: CT HEAD WITHOUT CONTRAST TECHNIQUE: Contiguous axial images were obtained from the base of the skull through the vertex without intravenous contrast. COMPARISON:  None. FINDINGS: Brain: Age-indeterminate small vessel ischemic disease of periventricular white matter. No hydrocephalus, intra-axial mass nor extra-axial fluid collections. No hemorrhage, large vascular territory infarct, midline shift or edema. Midline fourth ventricle and basal cisterns. Vascular: No hyperdense vessel sign. Atherosclerosis of the carotid siphons. Skull: Negative Sinuses/Orbits: Nonacute Other: None IMPRESSION: Age-indeterminate small vessel ischemic disease of periventricular white matter. Electronically Signed   By: Tollie Eth M.D.   On: 09/20/2018 19:38      Assessment & Plan  TIA    Work-up until now negative  with negative CT of the head and blood work    No deficits on physical exam    Neurochecks    Start p.o. if cleared by nursing    MRI/MRA/echocardiogram ordered    Lipid profile/hemoglobin A1c ordered    Check EKG/ordered      Hypertension  Uncontrolled,?  Hypertensive urgency  Add hydralazine p.o. and IV as needed  Anxiety/depression Hold venlafaxine and place on as needed Xanax.  Hyponatremia Probably due to hydrochlorothiazide Monitor    DVT Prophylaxis Lovenox  AM Labs Ordered, also please review Full Orders  Family Communication: Admission, patients condition and plan of  care including tests being ordered have been discussed with the patient and husband who indicate understanding and agree with the plan and Code Status.  Code Status full  Disposition Plan: Home  Time spent in minutes : 40 minutes  Condition GUARDED   @SIGNATURE @

## 2018-09-20 NOTE — ED Notes (Signed)
Bed: WA15 Expected date:  Expected time:  Means of arrival:  Comments: ResB 

## 2018-09-20 NOTE — ED Notes (Signed)
Pt placed in hospital bed and husband given pillow and recliner

## 2018-09-20 NOTE — ED Provider Notes (Signed)
Medical screening examination/treatment/procedure(s) were conducted as a shared visit with non-physician practitioner(s) and myself.  I personally evaluated the patient during the encounter.  EKG Interpretation  Date/Time:  Sunday September 20 2018 22:14:37 EDT Ventricular Rate:  73 PR Interval:    QRS Duration: 103 QT Interval:  430 QTC Calculation: 474 R Axis:   80 Text Interpretation:  Sinus rhythm Probable anteroseptal infarct, old no change from previous Confirmed by Arby Barrette [phone]) on 09/21/2018 12:25:55 AM  Patient has had several episodes of tingling of her face and left hand.  Last episode within 1 hour of arrival.  Patient is alert and appropriate.  Heart is regular without gross rub murmur gallop.  Lungs are clear.  Movements are coordinated purposeful and symmetric.  Cranial nerves are intact.  With recurrent episodes I have concern for TIA.  Per neurology recommendation patient will be transferred for MRI.  I agree with plan and management.   Arby Barrette, MD 10/01/18 [phone]

## 2018-09-20 NOTE — ED Triage Notes (Signed)
Pt c/o tingling and numbness in left face, lips and left hand last night, and again this afternoon. Denies pain, dysphasia, or receptive aphasia. Pt displays bi-lateral equal strength and feeling in all four limbs, no facial droop, NIH score of 0 on initial assessment. Hx of hypertension.

## 2018-09-21 ENCOUNTER — Observation Stay (HOSPITAL_BASED_OUTPATIENT_CLINIC_OR_DEPARTMENT_OTHER): Payer: Medicare Other

## 2018-09-21 ENCOUNTER — Encounter (HOSPITAL_COMMUNITY): Payer: Self-pay | Admitting: Neurology

## 2018-09-21 ENCOUNTER — Observation Stay (HOSPITAL_COMMUNITY): Payer: Medicare Other

## 2018-09-21 ENCOUNTER — Other Ambulatory Visit: Payer: Self-pay

## 2018-09-21 DIAGNOSIS — F419 Anxiety disorder, unspecified: Secondary | ICD-10-CM

## 2018-09-21 DIAGNOSIS — R531 Weakness: Secondary | ICD-10-CM

## 2018-09-21 DIAGNOSIS — G459 Transient cerebral ischemic attack, unspecified: Secondary | ICD-10-CM

## 2018-09-21 DIAGNOSIS — I10 Essential (primary) hypertension: Secondary | ICD-10-CM | POA: Diagnosis not present

## 2018-09-21 DIAGNOSIS — I361 Nonrheumatic tricuspid (valve) insufficiency: Secondary | ICD-10-CM | POA: Diagnosis not present

## 2018-09-21 DIAGNOSIS — E871 Hypo-osmolality and hyponatremia: Secondary | ICD-10-CM

## 2018-09-21 DIAGNOSIS — R42 Dizziness and giddiness: Secondary | ICD-10-CM | POA: Diagnosis not present

## 2018-09-21 HISTORY — DX: Transient cerebral ischemic attack, unspecified: G45.9

## 2018-09-21 LAB — BASIC METABOLIC PANEL WITH GFR
Anion gap: 9 (ref 5–15)
BUN: 10 mg/dL (ref 8–23)
CO2: 25 mmol/L (ref 22–32)
Calcium: 8.7 mg/dL — ABNORMAL LOW (ref 8.9–10.3)
Chloride: 103 mmol/L (ref 98–111)
Creatinine, Ser: 0.77 mg/dL (ref 0.44–1.00)
GFR calc Af Amer: >60 mL/min (ref >60)
GFR calc non Af Amer: >60 mL/min (ref >60)
Glucose, Bld: 95 mg/dL (ref 70–99)
Potassium: 3.2 mmol/L — ABNORMAL LOW (ref 3.5–5.1)
Sodium: 137 mmol/L (ref 135–145)

## 2018-09-21 LAB — ECHOCARDIOGRAM COMPLETE
Height: 66 in
Weight: 2320 oz

## 2018-09-21 LAB — HEMOGLOBIN A1C
HEMOGLOBIN A1C: 4.7 % — AB (ref 4.8–5.6)
MEAN PLASMA GLUCOSE: 88.19 mg/dL

## 2018-09-21 LAB — LIPID PANEL
Cholesterol: 200 mg/dL (ref 0–200)
HDL: 73 mg/dL (ref >40)
LDL Cholesterol: 113 mg/dL — ABNORMAL HIGH (ref 0–99)
Total CHOL/HDL Ratio: 2.7 ratio
Triglycerides: 71 mg/dL (ref <150)
VLDL: 14 mg/dL (ref 0–40)

## 2018-09-21 LAB — CREATININE, URINE, RANDOM: Creatinine, Urine: 38.23 mg/dL

## 2018-09-21 LAB — SODIUM, URINE, RANDOM: SODIUM UR: 79 mmol/L

## 2018-09-21 MED ORDER — CLOPIDOGREL BISULFATE 75 MG PO TABS: 75.0000 mg | ORAL_TABLET | Freq: Every day | ORAL | Status: DC

## 2018-09-21 MED ORDER — SODIUM CHLORIDE 0.9 % IV BOLUS
1000.0000 mL | Freq: Once | INTRAVENOUS | Status: AC
  Administered 2018-09-21: 1000 mL via INTRAVENOUS

## 2018-09-21 MED ORDER — METOPROLOL TARTRATE 25 MG PO TABS: 25.0000 mg | ORAL_TABLET | Freq: Two times a day (BID) | ORAL | 1 refills | Status: AC

## 2018-09-21 MED ORDER — LISINOPRIL 20 MG PO TABS
20.0000 mg | ORAL_TABLET | Freq: Every day | ORAL | Status: DC
  Administered 2018-09-21: 20 mg via ORAL
  Filled 2018-09-21: qty 1

## 2018-09-21 MED ORDER — ASPIRIN 81 MG PO TBEC: 81.0000 mg | DELAYED_RELEASE_TABLET | Freq: Every day | ORAL | Status: DC

## 2018-09-21 MED ORDER — ASPIRIN EC 81 MG PO TBEC
81.0000 mg | DELAYED_RELEASE_TABLET | Freq: Every day | ORAL | Status: DC
  Administered 2018-09-21: 81 mg via ORAL
  Filled 2018-09-21: qty 1

## 2018-09-21 MED ORDER — CLOPIDOGREL BISULFATE 300 MG PO TABS
300.0000 mg | ORAL_TABLET | Freq: Once | ORAL | Status: AC
  Administered 2018-09-21: 300 mg via ORAL
  Filled 2018-09-21: qty 1

## 2018-09-21 MED ORDER — OMEPRAZOLE 20 MG PO CPDR: DELAYED_RELEASE_CAPSULE | ORAL | 1 refills | Status: DC

## 2018-09-21 MED ORDER — VENLAFAXINE HCL ER 37.5 MG PO CP24: 37.5000 mg | ORAL_CAPSULE | Freq: Every day | ORAL | Status: DC

## 2018-09-21 MED ORDER — METOPROLOL TARTRATE 25 MG PO TABS
25.0000 mg | ORAL_TABLET | Freq: Two times a day (BID) | ORAL | Status: DC
  Administered 2018-09-21: 25 mg via ORAL
  Filled 2018-09-21: qty 1

## 2018-09-21 MED ORDER — POTASSIUM CHLORIDE CRYS ER 20 MEQ PO TBCR
40.0000 meq | EXTENDED_RELEASE_TABLET | Freq: Two times a day (BID) | ORAL | Status: DC
  Administered 2018-09-21: 40 meq via ORAL
  Filled 2018-09-21: qty 2

## 2018-09-21 MED ORDER — CLOPIDOGREL BISULFATE 75 MG PO TABS: 75.0000 mg | ORAL_TABLET | Freq: Every day | ORAL | 3 refills | Status: DC

## 2018-09-21 MED ORDER — ATORVASTATIN CALCIUM 80 MG PO TABS: 80.0000 mg | ORAL_TABLET | Freq: Every day | ORAL | 3 refills | Status: AC

## 2018-09-21 MED ORDER — ATORVASTATIN CALCIUM 80 MG PO TABS
80.0000 mg | ORAL_TABLET | Freq: Every day | ORAL | Status: DC
  Filled 2018-09-21: qty 1

## 2018-09-21 MED ORDER — METOPROLOL TARTRATE 25 MG PO TABS: 12.5000 mg | ORAL_TABLET | Freq: Two times a day (BID) | ORAL | 0 refills | Status: DC

## 2018-09-21 MED ORDER — HYDROCHLOROTHIAZIDE 25 MG PO TABS
25.0000 mg | ORAL_TABLET | Freq: Every day | ORAL | Status: DC
  Administered 2018-09-21: 25 mg via ORAL
  Filled 2018-09-21: qty 1

## 2018-09-21 NOTE — Progress Notes (Signed)
Preliminary notes--Bilateral carotid duplex exam completed. Right ICA 40-59% stenosis, Left ICA 1-39% stenosis. Right vertebral artery demonstrate bi-directional flow, Left vertebral artery with antegrade flow. Vashti Bolanos H Brenlee Koskela(RDMS RVT) 09/21/18 3:11 PM

## 2018-09-21 NOTE — ED Notes (Signed)
Orthostatics documented. Negative.

## 2018-09-21 NOTE — Discharge Summary (Signed)
Physician Discharge Summary  IVIS NICOLSON ZOX:[id number] DOB: 08-13-1946 DOA: 09/20/2018  PCP: Maurice Small, MD  Admit date: 09/20/2018 Discharge date: 09/21/2018  Admitted From: Home Disposition:  Home   Recommendations for Outpatient Follow-up:  1. Follow up with Neurology in 1-2 weeks 2. Titrate antihypertensive medications as tolerated. 3. She will need a basic metabolic panel panel as an outpatient in 1 to 2 weeks to follow-up on her sodium levels.  Home Health:No  Equipment/Devices:None    Discharge Condition:Stable  CODE STATUS:Full  Diet recommendation: Heart Healthy   Brief/Interim Summary: 71 y.o. femalepast medical history of hypertension since to the ED with a 2 episodes of TIA symptoms on the left side.  Discharge Diagnoses:  Active Problems:   TIA (transient ischemic attack)   Essential hypertension   Anxiety   Hyponatremia  TIA (transient ischemic attack) HgbA1c  at the time of this dictation neurology and PCP to follow-up., fasting lipid panel HDL greater than 40 LDL  110 she was started on high-dose statins. MRI of the brain without contrast: Show no acute CVA, moderate chronic microvascular changes and volume loss. MRA showed no large vessels occlusion. PT, OT, pending, SLP no abnormalities. Echocardiogram  pending at the time of this dictation.  And carotid doppler  right ICA less than 60% stenosis, left ICA less than 39 with a vertebral artery flow bidirectionally Prophylactic therapy-Antiplatelet WJX:BJYNWGNF aspirin and Plavix for 3 weeks then aspirin daily. No events cardiac Monitoring  Neurology has been  Consulted who agree with current management and recommended to follow-up with them in 2 weeks. Orthostatics were checked showed an increase in her heart rate she was given a liter of normal saline and her heart rate improved.  Essential hypertension Blood pressure on admission was 205/91 and has improved slowly.  This morning is  143/80 She was continue on hydralazine hydrochlorothiazide and lisinopril. Metoprolol low-dose was added.  We will follow-up with PCP and titrate antihypertensive medications as tolerated.  Anxiety Resume Effexor.  Hyponatremia She seems to be euvolemic, fractional excretion of sodium was 1, she will need to have a basic metabolic panel repeated in 1 to 2 weeks.   Discharge Instructions  Discharge Instructions    Diet - low sodium heart healthy   Complete by:  As directed    Diet - low sodium heart healthy   Complete by:  As directed    Increase activity slowly   Complete by:  As directed    Increase activity slowly   Complete by:  As directed      Allergies as of 09/21/2018      Reactions   Potassium-containing Compounds    Sulfur       Medication List    STOP taking these medications   ibuprofen 200 MG tablet Commonly known as:  ADVIL,MOTRIN     TAKE these medications   ACID REDUCER PO Take 1 tablet by mouth daily.   aspirin 81 MG EC tablet Take 1 tablet (81 mg total) by mouth daily. Start taking on:  09/22/2018   atorvastatin 80 MG tablet Commonly known as:  LIPITOR Take 1 tablet (80 mg total) by mouth daily at 6 PM.   clopidogrel 75 MG tablet Commonly known as:  PLAVIX Take 1 tablet (75 mg total) by mouth daily. Start taking on:  09/22/2018   metoprolol tartrate 25 MG tablet Commonly known as:  LOPRESSOR Take 1 tablet (25 mg total) by mouth 2 (two) times daily.   metroNIDAZOLE 0.75 % cream  Commonly known as:  METROCREAM Apply 1 application topically 2 (two) times daily as needed (irritation).   omeprazole 20 MG capsule Commonly known as:  PRILOSEC 1 CAPSULE TWICE A DAY ORALLY 90 DAYS   quinapril-hydrochlorothiazide 20-25 MG tablet Commonly known as:  ACCURETIC Take 1 tablet by mouth daily after breakfast.   venlafaxine XR 37.5 MG 24 hr capsule Commonly known as:  EFFEXOR-XR 37.5 mg daily with breakfast.       Allergies  Allergen  Reactions  . Potassium-Containing Compounds   . Sulfur     Consultations:  Neurology   Procedures/Studies: Dg Chest 2 View  Result Date: 09/20/2018 CLINICAL DATA:  Tingling and numbness left base since last night. EXAM: CHEST - 2 VIEW COMPARISON:  04/04/2005 FINDINGS: Lungs are adequately inflated without focal airspace consolidation or effusion. Cardiomediastinal silhouette, bones and soft tissues are unchanged. IMPRESSION: No active cardiopulmonary disease. Electronically Signed   By: Elberta Fortis M.D.   On: 09/20/2018 23:38   Ct Head Wo Contrast  Result Date: 09/20/2018 CLINICAL DATA:  Tingling and numbness of the face lips and left hand last evening and again this afternoon. EXAM: CT HEAD WITHOUT CONTRAST TECHNIQUE: Contiguous axial images were obtained from the base of the skull through the vertex without intravenous contrast. COMPARISON:  None. FINDINGS: Brain: Age-indeterminate small vessel ischemic disease of periventricular white matter. No hydrocephalus, intra-axial mass nor extra-axial fluid collections. No hemorrhage, large vascular territory infarct, midline shift or edema. Midline fourth ventricle and basal cisterns. Vascular: No hyperdense vessel sign. Atherosclerosis of the carotid siphons. Skull: Negative Sinuses/Orbits: Nonacute Other: None IMPRESSION: Age-indeterminate small vessel ischemic disease of periventricular white matter. Electronically Signed   By: Tollie Eth M.D.   On: 09/20/2018 19:38   Mr Brain Wo Contrast  Result Date: 09/21/2018 CLINICAL DATA:  72 y/o F; tingling and numbness of the left face, lips, and left hand last night and again this afternoon. TIA, initial exam. EXAM: MRI HEAD WITHOUT CONTRAST MRA HEAD WITHOUT CONTRAST TECHNIQUE: Multiplanar, multiecho pulse sequences of the brain and surrounding structures were obtained without intravenous contrast. Angiographic images of the head were obtained using MRA technique without contrast. COMPARISON:   09/20/2018 CT head. FINDINGS: MRI HEAD FINDINGS Brain: No acute infarction, hemorrhage, hydrocephalus, extra-axial collection or mass lesion. Several nonspecific T2 FLAIR hyperintensities in subcortical and periventricular white matter are compatible with moderate chronic microvascular ischemic changes. Moderate volume loss of the brain. Vascular: As below. Skull and upper cervical spine: Normal marrow signal. Sinuses/Orbits: Negative. Other: None. MRA HEAD FINDINGS Internal carotid arteries:  Patent. Anterior cerebral arteries:  Patent. Middle cerebral arteries: Patent. Anterior communicating artery: Patent. Posterior communicating arteries: Probable small left posterior communicating artery. No right posterior communicating artery identified, likely hypoplastic or absent. Posterior cerebral arteries:  Patent. Basilar artery:  Patent. Vertebral arteries:  Patent.  Right dominant. No evidence of high-grade stenosis, large vessel occlusion, or aneurysm. IMPRESSION: 1. No acute intracranial abnormality identified. 2. Moderate chronic microvascular ischemic changes and volume loss of the brain. 3. Patent anterior and posterior intracranial circulation. No large vessel occlusion, aneurysm, or significant stenosis. Electronically Signed   By: Mitzi Hansen M.D.   On: 09/21/2018 07:00   Mr Shirlee Latch ZO Contrast  Result Date: 09/21/2018 CLINICAL DATA:  72 y/o F; tingling and numbness of the left face, lips, and left hand last night and again this afternoon. TIA, initial exam. EXAM: MRI HEAD WITHOUT CONTRAST MRA HEAD WITHOUT CONTRAST TECHNIQUE: Multiplanar, multiecho pulse sequences of the brain and  surrounding structures were obtained without intravenous contrast. Angiographic images of the head were obtained using MRA technique without contrast. COMPARISON:  09/20/2018 CT head. FINDINGS: MRI HEAD FINDINGS Brain: No acute infarction, hemorrhage, hydrocephalus, extra-axial collection or mass lesion. Several  nonspecific T2 FLAIR hyperintensities in subcortical and periventricular white matter are compatible with moderate chronic microvascular ischemic changes. Moderate volume loss of the brain. Vascular: As below. Skull and upper cervical spine: Normal marrow signal. Sinuses/Orbits: Negative. Other: None. MRA HEAD FINDINGS Internal carotid arteries:  Patent. Anterior cerebral arteries:  Patent. Middle cerebral arteries: Patent. Anterior communicating artery: Patent. Posterior communicating arteries: Probable small left posterior communicating artery. No right posterior communicating artery identified, likely hypoplastic or absent. Posterior cerebral arteries:  Patent. Basilar artery:  Patent. Vertebral arteries:  Patent.  Right dominant. No evidence of high-grade stenosis, large vessel occlusion, or aneurysm. IMPRESSION: 1. No acute intracranial abnormality identified. 2. Moderate chronic microvascular ischemic changes and volume loss of the brain. 3. Patent anterior and posterior intracranial circulation. No large vessel occlusion, aneurysm, or significant stenosis. Electronically Signed   By: Mitzi Hansen M.D.   On: 09/21/2018 07:00       Subjective: She feels great no new complaints.  Discharge Exam: Vitals:   09/21/18 1701 09/21/18 1702  BP: (!) 126/58 139/68  Pulse: 88 (!) 102  Resp: 19 18  Temp:    SpO2: 99% 100%   Vitals:   09/21/18 1200 09/21/18 1300 09/21/18 1701 09/21/18 1702  BP: (!) 149/79 (!) 144/77 (!) 126/58 139/68  Pulse:  93 88 (!) 102  Resp: 16 14 19 18   Temp:      TempSrc:      SpO2: 98% 98% 99% 100%  Weight:      Height:        General: Pt is alert, awake, not in acute distress Cardiovascular: RRR, S1/S2 +, no rubs, no gallops Respiratory: CTA bilaterally, no wheezing, no rhonchi Abdominal: Soft, NT, ND, bowel sounds + Extremities: no edema, no cyanosis    The results of significant diagnostics from this hospitalization (including imaging,  microbiology, ancillary and laboratory) are listed below for reference.     Microbiology: No results found for this or any previous visit (from the past 240 hour(s)).   Labs: BNP (last 3 results) No results for input(s): BNP in the last 8760 hours. Basic Metabolic Panel: Recent Labs  Lab 09/20/18 1921 09/20/18 1929 09/21/18 1052  NA 133* 132* 137  K 3.5 3.5 3.2*  CL 98 97* 103  CO2 24  --  25  GLUCOSE 87 85 95  BUN 15 17 10   CREATININE 0.79 0.80 0.77  CALCIUM 8.8*  --  8.7*   Liver Function Tests: Recent Labs  Lab 09/20/18 1921  AST 23  ALT 13  ALKPHOS 40  BILITOT 0.4  PROT 7.4  ALBUMIN 4.4   No results for input(s): LIPASE, AMYLASE in the last 168 hours. No results for input(s): AMMONIA in the last 168 hours. CBC: Recent Labs  Lab 09/20/18 1921 09/20/18 1929  WBC 5.0  --   NEUTROABS 2.9  --   HGB 12.9 12.9  HCT 38.6 38.0  MCV 85.2  --   PLT 156  --    Cardiac Enzymes: No results for input(s): CKTOTAL, CKMB, CKMBINDEX, TROPONINI in the last 168 hours. BNP: Invalid input(s): POCBNP CBG: No results for input(s): GLUCAP in the last 168 hours. D-Dimer No results for input(s): DDIMER in the last 72 hours. Hgb A1c Recent Labs  09/21/18 0555  HGBA1C 4.7*   Lipid Profile Recent Labs    09/21/18 0555  CHOL 200  HDL 73  LDLCALC 113*  TRIG 71  CHOLHDL 2.7   Thyroid function studies No results for input(s): TSH, T4TOTAL, T3FREE, THYROIDAB in the last 72 hours.  Invalid input(s): FREET3 Anemia work up No results for input(s): VITAMINB12, FOLATE, FERRITIN, TIBC, IRON, RETICCTPCT in the last 72 hours. Urinalysis    Component Value Date/Time   COLORURINE STRAW (A) 09/20/2018 2007   APPEARANCEUR CLEAR 09/20/2018 2007   LABSPEC 1.006 09/20/2018 2007   PHURINE 7.0 09/20/2018 2007   GLUCOSEU NEGATIVE 09/20/2018 2007   HGBUR SMALL (A) 09/20/2018 2007   BILIRUBINUR NEGATIVE 09/20/2018 2007   KETONESUR NEGATIVE 09/20/2018 2007   PROTEINUR NEGATIVE  09/20/2018 2007   NITRITE NEGATIVE 09/20/2018 2007   LEUKOCYTESUR NEGATIVE 09/20/2018 2007   Sepsis Labs Invalid input(s): PROCALCITONIN,  WBC,  LACTICIDVEN Microbiology No results found for this or any previous visit (from the past 240 hour(s)).   Time coordinating discharge:  40 minutes  SIGNED:   Marinda Elk, MD  [facility] 09/21/2018, 5:15 PM Pager   If 7PM-7AM, please contact night-coverage [email or web address] Password [email or web address]

## 2018-09-21 NOTE — Progress Notes (Signed)
PT Cancellation Note  Patient Details Name: Ashley Buck MRN: [id number] DOB: [birth year]   Cancelled Treatment:    Reason Eval/Treat Not Completed: Patient at procedure or test/unavailable(carotid US)   Krystie Leiter,KATHrine E 09/21/2018, 2:39 PM

## 2018-09-21 NOTE — Evaluation (Signed)
OT Cancellation Note  Patient Details Name: Ashley Buck MRN: [id number] DOB: 15-Jan-1946   Cancelled Treatment:    Reason Eval/Treat Not Completed: Patient at procedure or test/ unavailable (carotid US).  Dalphine Handing, MSOT, OTR/L Behavioral [facility] OT/ Acute Relief OT [facility] Office: [phone]  Dalphine Handing 09/21/2018, 3:11 PM

## 2018-09-21 NOTE — ED Notes (Addendum)
Spoke with hospitalist, plan of care is to release patient home after PT/OT eval, 1000mg  fluid, and orthostatic vitals. Patient prefers not to change rooms at this time and would like to stay under this RN's care until D/C. Plan of care discussed with charge nurse.

## 2018-09-21 NOTE — ED Notes (Addendum)
Pt attempted to walk to restroom. Upon walking to restroom pt stopped in hallway and states " I am having one of my spells" Pt reports numbness around the left side of lips. Pt reports a heavy feeling and "weird feeling in her head" Pt assisted back to bed. Pt states "This feeling will pass in just a moment. I just need to sit here" vitals WDL. Will continue to monitor.

## 2018-09-21 NOTE — Progress Notes (Addendum)
[facility] PROGRESS NOTE    Progress Note  Ashley Buck  BJY:[id number] DOB: [birth year] DOA: 09/20/2018 PCP: Maurice Small, MD     Brief Narrative:   Ashley Buck is an 72 y.o. femalepast medical history of hypertension since to the ED with a 2 episodes of TIA symptoms on the left side.  Assessment/Plan:   TIA (transient ischemic attack) HgbA1c pending, fasting lipid panel HDL greater than 40 LDL  110 MRI of the brain without contrast: Show no acute CVA, moderate chronic microvascular changes and volume loss. MRA showed no large vessels occlusion. PT, OT, pending, SLP pending. Echocardiogram and carotid doppler pending Prophylactic therapy-Antiplatelet YQM:VHQIONGE aspirin and Plavix.  For 3 weeks then aspirin daily. No events cardiac Monitoring  Neurochecks q4h  Keep MAP 70 Start statins. Neurology has been consulted.  Essential hypertension Blood pressure on admission was 205/91 and has improved slowly.  This morning is 143/80 Continue hydralazine, hydrochlorothiazide and lisinopril. As per patient she relates she is compliant with her location.  Anxiety Resume Effexor.  Hyponatremia Check urinary sodium and urinary creatinine, although she has already received IV fluids.  Check a basic metabolic panel this morning.  She seems to be euvolemic  DVT prophylaxis: lovenox Family Communication:husband Disposition Plan/Barrier to D/C: home in am Code Status:     Code Status Orders  (From admission, onward)         Start     Ordered   09/20/18 2253  Full code  Continuous     09/20/18 2252        Code Status History    This patient has a current code status but no historical code status.        IV Access:    Peripheral IV   Procedures and diagnostic studies:   Dg Chest 2 View  Result Date: 09/20/2018 CLINICAL DATA:  Tingling and numbness left base since last night. EXAM: CHEST - 2 VIEW COMPARISON:  04/04/2005 FINDINGS: Lungs are  adequately inflated without focal airspace consolidation or effusion. Cardiomediastinal silhouette, bones and soft tissues are unchanged. IMPRESSION: No active cardiopulmonary disease. Electronically Signed   By: Elberta Fortis M.D.   On: 09/20/2018 23:38   Ct Head Wo Contrast  Result Date: 09/20/2018 CLINICAL DATA:  Tingling and numbness of the face lips and left hand last evening and again this afternoon. EXAM: CT HEAD WITHOUT CONTRAST TECHNIQUE: Contiguous axial images were obtained from the base of the skull through the vertex without intravenous contrast. COMPARISON:  None. FINDINGS: Brain: Age-indeterminate small vessel ischemic disease of periventricular white matter. No hydrocephalus, intra-axial mass nor extra-axial fluid collections. No hemorrhage, large vascular territory infarct, midline shift or edema. Midline fourth ventricle and basal cisterns. Vascular: No hyperdense vessel sign. Atherosclerosis of the carotid siphons. Skull: Negative Sinuses/Orbits: Nonacute Other: None IMPRESSION: Age-indeterminate small vessel ischemic disease of periventricular white matter. Electronically Signed   By: Tollie Eth M.D.   On: 09/20/2018 19:38   Mr Brain Wo Contrast  Result Date: 09/21/2018 CLINICAL DATA:  72 y/o F; tingling and numbness of the left face, lips, and left hand last night and again this afternoon. TIA, initial exam. EXAM: MRI HEAD WITHOUT CONTRAST MRA HEAD WITHOUT CONTRAST TECHNIQUE: Multiplanar, multiecho pulse sequences of the brain and surrounding structures were obtained without intravenous contrast. Angiographic images of the head were obtained using MRA technique without contrast. COMPARISON:  09/20/2018 CT head. FINDINGS: MRI HEAD FINDINGS Brain: No acute infarction, hemorrhage, hydrocephalus, extra-axial collection or mass lesion. Several  nonspecific T2 FLAIR hyperintensities in subcortical and periventricular white matter are compatible with moderate chronic microvascular ischemic  changes. Moderate volume loss of the brain. Vascular: As below. Skull and upper cervical spine: Normal marrow signal. Sinuses/Orbits: Negative. Other: None. MRA HEAD FINDINGS Internal carotid arteries:  Patent. Anterior cerebral arteries:  Patent. Middle cerebral arteries: Patent. Anterior communicating artery: Patent. Posterior communicating arteries: Probable small left posterior communicating artery. No right posterior communicating artery identified, likely hypoplastic or absent. Posterior cerebral arteries:  Patent. Basilar artery:  Patent. Vertebral arteries:  Patent.  Right dominant. No evidence of high-grade stenosis, large vessel occlusion, or aneurysm. IMPRESSION: 1. No acute intracranial abnormality identified. 2. Moderate chronic microvascular ischemic changes and volume loss of the brain. 3. Patent anterior and posterior intracranial circulation. No large vessel occlusion, aneurysm, or significant stenosis. Electronically Signed   By: Mitzi Hansen M.D.   On: 09/21/2018 07:00   Mr Shirlee Latch ZO Contrast  Result Date: 09/21/2018 CLINICAL DATA:  72 y/o F; tingling and numbness of the left face, lips, and left hand last night and again this afternoon. TIA, initial exam. EXAM: MRI HEAD WITHOUT CONTRAST MRA HEAD WITHOUT CONTRAST TECHNIQUE: Multiplanar, multiecho pulse sequences of the brain and surrounding structures were obtained without intravenous contrast. Angiographic images of the head were obtained using MRA technique without contrast. COMPARISON:  09/20/2018 CT head. FINDINGS: MRI HEAD FINDINGS Brain: No acute infarction, hemorrhage, hydrocephalus, extra-axial collection or mass lesion. Several nonspecific T2 FLAIR hyperintensities in subcortical and periventricular white matter are compatible with moderate chronic microvascular ischemic changes. Moderate volume loss of the brain. Vascular: As below. Skull and upper cervical spine: Normal marrow signal. Sinuses/Orbits: Negative. Other:  None. MRA HEAD FINDINGS Internal carotid arteries:  Patent. Anterior cerebral arteries:  Patent. Middle cerebral arteries: Patent. Anterior communicating artery: Patent. Posterior communicating arteries: Probable small left posterior communicating artery. No right posterior communicating artery identified, likely hypoplastic or absent. Posterior cerebral arteries:  Patent. Basilar artery:  Patent. Vertebral arteries:  Patent.  Right dominant. No evidence of high-grade stenosis, large vessel occlusion, or aneurysm. IMPRESSION: 1. No acute intracranial abnormality identified. 2. Moderate chronic microvascular ischemic changes and volume loss of the brain. 3. Patent anterior and posterior intracranial circulation. No large vessel occlusion, aneurysm, or significant stenosis. Electronically Signed   By: Mitzi Hansen M.D.   On: 09/21/2018 07:00     Medical Consultants:    None.  Anti-Infectives:   None  Subjective:    Ashley Buck July she has no further symptoms feels great.  Objective:    Vitals:   09/21/18 0759 09/21/18 0810 09/21/18 0900 09/21/18 0935  BP:  (!) 159/78 (!) 143/80   Pulse:  95    Resp: 19 18 (!) 25 16  Temp:      TempSrc:      SpO2: 97% 98% 97%   Weight:      Height:       No intake or output data in the 24 hours ending 09/21/18 1002 Filed Weights   09/20/18 1836  Weight: 65.8 kg    Exam: General exam: In no acute distress. Respiratory system: Good air movement and clear to auscultation. Cardiovascular system: S1 & S2 heard, RRR.   Gastrointestinal system: Abdomen is nondistended, soft and nontender.  Central nervous system: Alert and oriented. No focal neurological deficits. Extremities: No pedal edema. Skin: No rashes, lesions or ulcers Psychiatry: Judgement and insight appear normal. Mood & affect appropriate.    Data Reviewed:  Labs: Basic Metabolic Panel: Recent Labs  Lab 09/20/18 1921 09/20/18 1929  NA 133* 132*  K 3.5  3.5  CL 98 97*  CO2 24  --   GLUCOSE 87 85  BUN 15 17  CREATININE 0.79 0.80  CALCIUM 8.8*  --    GFR Estimated Creatinine Clearance: 57.8 mL/min (by C-G formula based on SCr of 0.8 mg/dL). Liver Function Tests: Recent Labs  Lab 09/20/18 1921  AST 23  ALT 13  ALKPHOS 40  BILITOT 0.4  PROT 7.4  ALBUMIN 4.4   No results for input(s): LIPASE, AMYLASE in the last 168 hours. No results for input(s): AMMONIA in the last 168 hours. Coagulation profile Recent Labs  Lab 09/20/18 1921  INR 0.88    CBC: Recent Labs  Lab 09/20/18 1921 09/20/18 1929  WBC 5.0  --   NEUTROABS 2.9  --   HGB 12.9 12.9  HCT 38.6 38.0  MCV 85.2  --   PLT 156  --    Cardiac Enzymes: No results for input(s): CKTOTAL, CKMB, CKMBINDEX, TROPONINI in the last 168 hours. BNP (last 3 results) No results for input(s): PROBNP in the last 8760 hours. CBG: No results for input(s): GLUCAP in the last 168 hours. D-Dimer: No results for input(s): DDIMER in the last 72 hours. Hgb A1c: Recent Labs    09/21/18 0555  HGBA1C 4.7*   Lipid Profile: Recent Labs    09/21/18 0555  CHOL 200  HDL 73  LDLCALC 113*  TRIG 71  CHOLHDL 2.7   Thyroid function studies: No results for input(s): TSH, T4TOTAL, T3FREE, THYROIDAB in the last 72 hours.  Invalid input(s): FREET3 Anemia work up: No results for input(s): VITAMINB12, FOLATE, FERRITIN, TIBC, IRON, RETICCTPCT in the last 72 hours. Sepsis Labs: Recent Labs  Lab 09/20/18 1921  WBC 5.0   Microbiology No results found for this or any previous visit (from the past 240 hour(s)).   Medications:   .  stroke: mapping our early stages of recovery book   Does not apply Once  . aspirin EC  81 mg Oral Daily  . clopidogrel  300 mg Oral Once  . [START ON 09/22/2018] clopidogrel  75 mg Oral Daily  . enoxaparin (LOVENOX) injection  40 mg Subcutaneous QHS  . hydrALAZINE  25 mg Oral BID  . hydrochlorothiazide  25 mg Oral Daily  . lisinopril  20 mg Oral Daily    . pantoprazole  40 mg Oral Daily   Continuous Infusions:    LOS: 0 days   Marinda Elk  [facility] Pager [phone]  *Please refer to [email or web address], password [email or web address] to get updated schedule on who will round on this patient, as hospitalists switch teams weekly. If 7PM-7AM, please contact night-coverage at [email or web address], password [email or web address] for any overnight needs.  09/21/2018, 10:02 AM

## 2018-09-21 NOTE — ED Notes (Signed)
Patient transported to MRI 

## 2018-09-21 NOTE — Consult Note (Addendum)
Neurology Consultation  Reason for Consult: Possible TIA Referring Physician: Robb Matar  CC: Tingling of left side and is intermittent  History is obtained from: Patient  [name] is a 72 y.o. female no significant stroke risk factors at this time.  Patient states that approximately 1600 hrs. on Saturday, was in her closet looked up and noticed that her left corner of her mouth, left hand and left leg had a tingling sensation that lasted for approximately 5 minutes and then dissipated.  She states that it dissipated when she sat down.  At approximately 1 hour later, patient had similar symptoms however she states that she felt that she had some slurring of her words.  Her husband did not notice this.  She also had similar symptoms as before.  Patient again had symptoms prior to arrival at the hospital.  On arrival she was noted to be hypertensive.  Husband states she has been under significant stress lately.  He was recently diagnosed with Parkinson's disease and this is extremely stressed her out.  Even talking to her about it briefly she started to get very stressed out about it.  She is also significantly stressed as her mother had died from a stroke 10 years ago that she was very worried about the symptoms.   ED course: Blood pressure control, MRI of head and MRA of head    LKW: 11/19/2018 at approximately 1600 hrs. tpa given?: no, out of window Premorbid modified Rankin scale (mRS): 0 NIH stroke score: 1   ROS: A 14 point ROS was performed and is negative except as noted in the HPI.  Past Medical History:  Diagnosis Date  . Depression   . GERD (gastroesophageal reflux disease)   . Stress     History reviewed. No pertinent family history. Mother had a stroke 10 years ago  Social History:   reports that she has quit smoking. She has never used smokeless tobacco. Her alcohol and drug histories are not on file.  Medications  Current Facility-Administered Medications:   .   stroke: mapping our early stages of recovery book, , Does not apply, Once, Carron Curie, MD .  acetaminophen (TYLENOL) tablet 650 mg, 650 mg, Oral, Q4H PRN **OR** acetaminophen (TYLENOL) solution 650 mg, 650 mg, Per Tube, Q4H PRN **OR** acetaminophen (TYLENOL) suppository 650 mg, 650 mg, Rectal, Q4H PRN, Carron Curie, MD .  ALPRAZolam Prudy Feeler) tablet 0.25 mg, 0.25 mg, Oral, TID PRN, Carron Curie, MD, 0.25 mg at 09/21/18 0557 .  enoxaparin (LOVENOX) injection 40 mg, 40 mg, Subcutaneous, QHS, Hijazi, Ali, MD, 40 mg at 09/21/18 0027 .  hydrALAZINE (APRESOLINE) injection 5 mg, 5 mg, Intravenous, Q4H PRN, Carron Curie, MD, 5 mg at 09/20/18 2234 .  hydrALAZINE (APRESOLINE) tablet 25 mg, 25 mg, Oral, BID, Carron Curie, MD, 25 mg at 09/21/18 0557 .  hydrochlorothiazide (HYDRODIURIL) tablet 25 mg, 25 mg, Oral, Daily, Hijazi, Karie Mainland, MD .  HYDROcodone-acetaminophen (NORCO) 10-325 MG per tablet 1 tablet, 1 tablet, Oral, Q6H PRN, Carron Curie, MD .  lisinopril (PRINIVIL,ZESTRIL) tablet 20 mg, 20 mg, Oral, Daily, Hijazi, Karie Mainland, MD .  pantoprazole (PROTONIX) EC tablet 40 mg, 40 mg, Oral, Daily, Hijazi, Karie Mainland, MD .  senna-docusate (Senokot-S) tablet 1 tablet, 1 tablet, Oral, QHS PRN, Carron Curie, MD  Current Outpatient Medications:  .  ibuprofen (ADVIL,MOTRIN) 200 MG tablet, Take 400 mg by mouth every 6 (six) hours as needed for mild pain or moderate pain., Disp: , Rfl:  .  metroNIDAZOLE (METROCREAM) 0.75 %  cream, Apply 1 application topically 2 (two) times daily as needed (irritation). , Disp: , Rfl: 2 .  quinapril-hydrochlorothiazide (ACCURETIC) 20-25 MG tablet, Take 1 tablet by mouth daily after breakfast. , Disp: , Rfl: 1 .  raNITIdine HCl (ACID REDUCER PO), Take 1 tablet by mouth daily., Disp: , Rfl:  .  venlafaxine XR (EFFEXOR-XR) 37.5 MG 24 hr capsule, 37.5 mg daily with breakfast. , Disp: , Rfl: 3   Exam: Current vital signs: BP (!) 159/78 (BP Location: Right Arm)   Pulse 95   Temp 97.7 F (36.5 C) (Oral)    Resp 18   Ht 5\' 6"  (1.676 m)   Wt 65.8 kg   SpO2 98%   BMI 23.40 kg/m  Vital signs in last 24 hours: Temp:  [97.7 F (36.5 C)] 97.7 F (36.5 C) (10/27 1830) Pulse Rate:  [66-95] 95 (10/28 0810) Resp:  [11-23] 18 (10/28 0810) BP: (137-210)/(78-111) 159/78 (10/28 0810) SpO2:  [94 %-100 %] 98 % (10/28 0810) Weight:  [65.8 kg] 65.8 kg (10/27 1836)  Physical Exam  Constitutional: Appears well-developed and well-nourished.  Psych: Affect appropriate to situation Eyes: No scleral injection HENT: No OP obstrucion Head: Normocephalic.  Cardiovascular: Normal rate and regular rhythm.  Respiratory: Effort normal, non-labored breathing GI: Soft.  No distension. There is no tenderness.  Skin: WDI  Neuro: Mental Status: Patient is awake, alert, oriented to person, place, month, year, and situation. Patient is able to give a clear and coherent history. No signs of aphasia or neglect Cranial Nerves: II: Visual Fields are full. Pupils are equal, round, and reactive to light.   III,IV, VI: EOMI without ptosis or diploplia.  V: Facial sensation is symmetric to temperature VII: Facial movement is symmetric.  Slight left nasolabial fold decrease VIII: hearing is intact to voice X: Uvula elevates symmetrically XI: Shoulder shrug is symmetric. XII: tongue is midline without atrophy or fasciculations.  Motor: Tone is normal. Bulk is normal. 5/5 strength was present in all four extremities.  Sensory: Sensation is symmetric to light touch and temperature in the arms and legs. Deep Tendon Reflexes: 2+ and symmetric in the biceps and patellae.  Plantars: Toes are downgoing bilaterally.  Cerebellar: FNF and HKS are intact bilaterally    Labs I have reviewed labs in epic and the results pertinent to this consultation are:   CBC    Component Value Date/Time   WBC 5.0 09/20/2018 1921   RBC 4.53 09/20/2018 1921   HGB 12.9 09/20/2018 1929   HCT 38.0 09/20/2018 1929   PLT 156  09/20/2018 1921   MCV 85.2 09/20/2018 1921   MCH 28.5 09/20/2018 1921   MCHC 33.4 09/20/2018 1921   RDW 13.0 09/20/2018 1921   LYMPHSABS 1.5 09/20/2018 1921   MONOABS 0.5 09/20/2018 1921   EOSABS 0.1 09/20/2018 1921   BASOSABS 0.0 09/20/2018 1921    CMP     Component Value Date/Time   NA 132 (L) 09/20/2018 1929   K 3.5 09/20/2018 1929   CL 97 (L) 09/20/2018 1929   CO2 24 09/20/2018 1921   GLUCOSE 85 09/20/2018 1929   BUN 17 09/20/2018 1929   CREATININE 0.80 09/20/2018 1929   CALCIUM 8.8 (L) 09/20/2018 1921   PROT 7.4 09/20/2018 1921   ALBUMIN 4.4 09/20/2018 1921   AST 23 09/20/2018 1921   ALT 13 09/20/2018 1921   ALKPHOS 40 09/20/2018 1921   BILITOT 0.4 09/20/2018 1921   GFRNONAA >60 09/20/2018 1921   GFRAA >60 09/20/2018 1921  Lipid Panel     Component Value Date/Time   CHOL 200 09/21/2018 0555   TRIG 71 09/21/2018 0555   HDL 73 09/21/2018 0555   CHOLHDL 2.7 09/21/2018 0555   VLDL 14 09/21/2018 0555   LDLCALC 113 (H) 09/21/2018 0555     Imaging I have reviewed the images obtained:  CT-scan of the brain-age indeterminant small vessel ischemic disease of periventricular white matter  MRI examination of the brain-no acute intracranial abnormalities, moderate chronic microvascular ischemic changes and volume loss.  No large vessel occlusion, aneurysm or significant stenosis.    Assessment: 72 year old female with no stroke risk factors at this time.  Patient has been under a lot of stress recently.  Given history differential at this time would include stress reaction versus small vessel TIA which can give stereotypical symptoms.  I do believe patient would benefit from a full stroke up given her age and the fact she does not take aspirin.  Recommendations: Recommend #Transthoracic Echo, # Start Lopez 2300 mg of Plavix now.  Then start dual antiplatelet therapy for 3 weeks which would be Plavix 75 mg daily with aspirin 81 mg.  Followed by just aspirin 81 mg  daily after the 3 weeks.  #Start or continue Atorvastatin 80 mg/other high intensity statin # HBAIC and Lipid profile # Telemetry monitoring # Frequent neuro checks # NPO until passes stroke swallow screen # please page stroke NP  Or  PA  Or MD from 8am -4 pm  as this patient from this time will be  followed by the stroke.   You can look them up on [email or web address]  Password [email or web address]  Felicie Morn PA-C [facility] [phone]  M-F  (9:00 am- 5:00 PM)  09/21/2018, 8:46 AM   I have seen the patient and reviewed the above note.  She has had recurrent left-sided weakness always associated with standing.  MRI is negative.  Carotid Dopplers with 40 to 59% in the right carotid.  The patient describes the symptoms only occur with standing and they continue to occur even here with standing.  I am concerned that she may have orthostatic BP drops with asymmetric symptoms due to the mild stenosis.  If she is orthostatic, I think the first approach might be to address this rather than surgery with such a low percent stenosis.  I do not think that this is at all likely to be recurrent emboli from this stenosis given the stereotyped nature of the symptoms.  Also possible would be stuttering small vessel infarct and for this she has been started on dual antiplatelet therapy and will need statin.  orthostatic vital signs I would ensure that she could be asymptomatic at more normal blood pressures given how hypertensive she presented. Other plan as above.  Ritta Slot, MD [facility] [phone]  If 7pm- 7am, please page neurology on call as listed in AMION.

## 2018-09-21 NOTE — Progress Notes (Signed)
  Echocardiogram 2D Echocardiogram has been performed.  Ashley Buck F 09/21/2018, 10:18 AM

## 2018-09-21 NOTE — ED Notes (Signed)
ECHO at bedside.

## 2018-10-13 ENCOUNTER — Emergency Department (HOSPITAL_BASED_OUTPATIENT_CLINIC_OR_DEPARTMENT_OTHER): Payer: Medicare Other

## 2018-10-13 ENCOUNTER — Encounter (HOSPITAL_BASED_OUTPATIENT_CLINIC_OR_DEPARTMENT_OTHER): Payer: Self-pay | Admitting: *Deleted

## 2018-10-13 ENCOUNTER — Inpatient Hospital Stay (HOSPITAL_BASED_OUTPATIENT_CLINIC_OR_DEPARTMENT_OTHER)
Admission: EM | Admit: 2018-10-13 | Discharge: 2018-10-15 | DRG: 065 | Disposition: A | Discharge status: Skilled Nursing Facility | Payer: Medicare Other | Attending: Internal Medicine | Admitting: Internal Medicine

## 2018-10-13 ENCOUNTER — Other Ambulatory Visit: Payer: Self-pay

## 2018-10-13 DIAGNOSIS — Z79899 Other long term (current) drug therapy: Secondary | ICD-10-CM | POA: Diagnosis not present

## 2018-10-13 DIAGNOSIS — F419 Anxiety disorder, unspecified: Secondary | ICD-10-CM | POA: Diagnosis present

## 2018-10-13 DIAGNOSIS — I639 Cerebral infarction, unspecified: Secondary | ICD-10-CM | POA: Diagnosis not present

## 2018-10-13 DIAGNOSIS — Z8673 Personal history of transient ischemic attack (TIA), and cerebral infarction without residual deficits: Secondary | ICD-10-CM

## 2018-10-13 DIAGNOSIS — R4701 Aphasia: Secondary | ICD-10-CM | POA: Diagnosis present

## 2018-10-13 DIAGNOSIS — R410 Disorientation, unspecified: Secondary | ICD-10-CM

## 2018-10-13 DIAGNOSIS — E785 Hyperlipidemia, unspecified: Secondary | ICD-10-CM

## 2018-10-13 DIAGNOSIS — I10 Essential (primary) hypertension: Secondary | ICD-10-CM | POA: Diagnosis present

## 2018-10-13 DIAGNOSIS — Z87891 Personal history of nicotine dependence: Secondary | ICD-10-CM | POA: Diagnosis not present

## 2018-10-13 DIAGNOSIS — Z7982 Long term (current) use of aspirin: Secondary | ICD-10-CM

## 2018-10-13 DIAGNOSIS — E119 Type 2 diabetes mellitus without complications: Secondary | ICD-10-CM | POA: Diagnosis present

## 2018-10-13 DIAGNOSIS — Z888 Allergy status to other drugs, medicaments and biological substances status: Secondary | ICD-10-CM

## 2018-10-13 DIAGNOSIS — Z7902 Long term (current) use of antithrombotics/antiplatelets: Secondary | ICD-10-CM | POA: Diagnosis not present

## 2018-10-13 DIAGNOSIS — R29700 NIHSS score 0: Secondary | ICD-10-CM | POA: Diagnosis present

## 2018-10-13 DIAGNOSIS — G9340 Encephalopathy, unspecified: Secondary | ICD-10-CM | POA: Diagnosis present

## 2018-10-13 DIAGNOSIS — E876 Hypokalemia: Secondary | ICD-10-CM | POA: Diagnosis present

## 2018-10-13 DIAGNOSIS — I6330 Cerebral infarction due to thrombosis of unspecified cerebral artery: Secondary | ICD-10-CM

## 2018-10-13 DIAGNOSIS — E871 Hypo-osmolality and hyponatremia: Secondary | ICD-10-CM | POA: Diagnosis not present

## 2018-10-13 DIAGNOSIS — K219 Gastro-esophageal reflux disease without esophagitis: Secondary | ICD-10-CM | POA: Diagnosis not present

## 2018-10-13 DIAGNOSIS — G9349 Other encephalopathy: Secondary | ICD-10-CM | POA: Diagnosis not present

## 2018-10-13 DIAGNOSIS — R4789 Other speech disturbances: Secondary | ICD-10-CM

## 2018-10-13 HISTORY — DX: Cerebral infarction, unspecified: I63.9

## 2018-10-13 HISTORY — DX: Transient cerebral ischemic attack, unspecified: G45.9

## 2018-10-13 LAB — CBC
HEMATOCRIT: 37.4 % (ref 36.0–46.0)
HEMOGLOBIN: 12.6 g/dL (ref 12.0–15.0)
MCH: 28.1 pg (ref 26.0–34.0)
MCHC: 33.7 g/dL (ref 30.0–36.0)
MCV: 83.5 fL (ref 80.0–100.0)
Platelets: 167 10*3/uL (ref 150–400)
RBC: 4.48 MIL/uL (ref 3.87–5.11)
RDW: 13.4 % (ref 11.5–15.5)
WBC: 4.9 10*3/uL (ref 4.0–10.5)
nRBC: 0 % (ref 0.0–0.2)

## 2018-10-13 LAB — DIFFERENTIAL
Abs Immature Granulocytes: 0.02 10*3/uL (ref 0.00–0.07)
Basophils Absolute: 0 10*3/uL (ref 0.0–0.1)
Basophils Relative: 1 %
EOS PCT: 1 %
Eosinophils Absolute: 0.1 10*3/uL (ref 0.0–0.5)
Immature Granulocytes: 0 %
LYMPHS ABS: 1.3 10*3/uL (ref 0.7–4.0)
LYMPHS PCT: 26 %
MONOS PCT: 9 %
Monocytes Absolute: 0.5 10*3/uL (ref 0.1–1.0)
NEUTROS ABS: 3 10*3/uL (ref 1.7–7.7)
Neutrophils Relative %: 63 %

## 2018-10-13 LAB — APTT: aPTT: 42 seconds — ABNORMAL HIGH (ref 24–36)

## 2018-10-13 LAB — COMPREHENSIVE METABOLIC PANEL
ALK PHOS: 42 U/L (ref 38–126)
ALT: 16 U/L (ref 0–44)
AST: 21 U/L (ref 15–41)
Albumin: 4.2 g/dL (ref 3.5–5.0)
Anion gap: 11 (ref 5–15)
BILIRUBIN TOTAL: 0.7 mg/dL (ref 0.3–1.2)
BUN: 15 mg/dL (ref 8–23)
CALCIUM: 9 mg/dL (ref 8.9–10.3)
CO2: 23 mmol/L (ref 22–32)
Chloride: 97 mmol/L — ABNORMAL LOW (ref 98–111)
Creatinine, Ser: 0.82 mg/dL (ref 0.44–1.00)
Glucose, Bld: 108 mg/dL — ABNORMAL HIGH (ref 70–99)
Potassium: 3.4 mmol/L — ABNORMAL LOW (ref 3.5–5.1)
Sodium: 131 mmol/L — ABNORMAL LOW (ref 135–145)
Total Protein: 7 g/dL (ref 6.5–8.1)

## 2018-10-13 LAB — TROPONIN I: Troponin I: <0.03 ng/mL (ref <0.03)

## 2018-10-13 LAB — PROTIME-INR
INR: 0.99
PROTHROMBIN TIME: 13 s (ref 11.4–15.2)

## 2018-10-13 LAB — CBG MONITORING, ED: Glucose-Capillary: 110 mg/dL — ABNORMAL HIGH (ref 70–99)

## 2018-10-13 NOTE — ED Triage Notes (Addendum)
Confused x 2 hours. She was in [facility] 3 weeks ago with possible stroke. She was not admitted but started on blood thinners. She is ambulatory. Alert to name, dob, some events of today but confused to some questions husband is asking her at triage.

## 2018-10-13 NOTE — ED Provider Notes (Signed)
Emergency Department Provider Note   I have reviewed the triage vital signs and the nursing notes.   HISTORY  Chief Complaint Altered Mental Status   HPI Ashley Buck is a 72 y.o. female with PMH of Depression, GERD, HTN, and TIA turns to the emergency department with acute onset "confusion" and inappropriate speech.  Patient's husband provides most of the history.  He states that they were driving on the road going to dinner when he noticed that she was very quiet.  He began asking her questions and realized that she was not responding appropriately.  He states that they were driving on a road that she is driven down many times and so he began to ask her questions about where they were going.  She was not able to identify stores that were coming up and seemed to be having trouble finding her words.  She did not seem frustrated by this.  He did not appreciate any slurred speech.  Patient denies feeling any numbness or weakness.  He states that eventually made it to the restaurant where the patient was unable to tell the name of the restaurant.  Eventually, the husband decided to take her by car to the emergency department but she refused to go with him.  The husband had to call her son to convince her.   Patient denies any significant symptoms at this time.  She is not experiencing any chest pain, heart palpitations, shortness of breath.  Has been states that they did change her anxiety medication recently but cannot recall the name.  Patient was admitted for strokelike symptoms at the end of October has been compliant with medications.   Past Medical History:  Diagnosis Date  . Depression   . GERD (gastroesophageal reflux disease)   . Stress     Patient Active Problem List   Diagnosis Date Noted  . Aphasia 10/13/2018  . Essential hypertension 09/21/2018  . Anxiety 09/21/2018  . Hyponatremia 09/21/2018  . TIA (transient ischemic attack) 09/20/2018  . Porokeratosis 10/24/2015    . Ingrown nail 10/24/2015  . Pain in lower limb 10/24/2015  . Onychomycosis 10/24/2015    Past Surgical History:  Procedure Laterality Date  . BREAST EXCISIONAL BIOPSY Left 2010   benign    Allergies Potassium-containing compounds and Sulfur  No family history on file.  Social History Social History   Tobacco Use  . Smoking status: Former Games developer  . Smokeless tobacco: Never Used  Substance Use Topics  . Alcohol use: Not on file  . Drug use: Not on file    Review of Systems  Constitutional: No fever/chills Eyes: No visual changes. ENT: No sore throat. Cardiovascular: Denies chest pain. Respiratory: Denies shortness of breath. Gastrointestinal: No abdominal pain.  No nausea, no vomiting.  No diarrhea.  No constipation. Genitourinary: Negative for dysuria. Musculoskeletal: Negative for back pain. Skin: Negative for rash. Neurological: Negative for headaches, focal weakness or numbness. Positive confusion and word finding difficulty.   10-point ROS otherwise negative.  ____________________________________________   PHYSICAL EXAM:  VITAL SIGNS: ED Triage Vitals  Enc Vitals Group     BP 10/13/18 2021 (!) 145/55     Pulse Rate 10/13/18 2021 (!) 56     Resp 10/13/18 2021 16     Temp 10/13/18 2021 97.8 F (36.6 C)     Temp Source 10/13/18 2021 Oral     SpO2 10/13/18 2021 100 %     Weight 10/13/18 2022 144 lb 13.5 oz (65.7  kg)     Height 10/13/18 2022 5\' 6"  (1.676 m)     Pain Score 10/13/18 2022 0   Constitutional: Alert and oriented but slow to respond. Well appearing and in no acute distress. Eyes: Conjunctivae are normal.  Head: Atraumatic. Nose: No congestion/rhinnorhea. Mouth/Throat: Mucous membranes are moist.   Neck: No stridor.  Cardiovascular: Normal rate, regular rhythm. Good peripheral circulation. Grossly normal heart sounds.   Respiratory: Normal respiratory effort.  No retractions. Lungs CTAB. Gastrointestinal: Soft and nontender. No  distention.  Musculoskeletal: No lower extremity tenderness nor edema. No gross deformities of extremities. Neurologic: Speech is hesitant with some word finding difficulty at times. Patient seems to be using word associations at time to get to answers to questions. No gross focal neurologic deficits are appreciated. Normal CN exam. No upper or lower extremity drift or sensory deficit.  Skin:  Skin is warm, dry and intact. No rash noted.  ____________________________________________   LABS (all labs ordered are listed, but only abnormal results are displayed)  Labs Reviewed  APTT - Abnormal; Notable for the following components:      Result Value   aPTT 42 (*)    All other components within normal limits  COMPREHENSIVE METABOLIC PANEL - Abnormal; Notable for the following components:   Sodium 131 (*)    Potassium 3.4 (*)    Chloride 97 (*)    Glucose, Bld 108 (*)    All other components within normal limits  CBG MONITORING, ED - Abnormal; Notable for the following components:   Glucose-Capillary 110 (*)    All other components within normal limits  PROTIME-INR  CBC  DIFFERENTIAL  TROPONIN I  URINALYSIS, ROUTINE W REFLEX MICROSCOPIC   ____________________________________________  EKG   EKG Interpretation  Date/Time:  Tuesday October 13 2018 20:31:23 EST Ventricular Rate:  60 PR Interval:    QRS Duration: 105 QT Interval:  443 QTC Calculation: 443 R Axis:   81 Text Interpretation:  Sinus rhythm Borderline right axis deviation Nonspecific T abnormalities, lateral leads Baseline wander in lead(s) I II aVR aVL aVF V1 V2 V3 V4 No STEMI.  Confirmed by Alona Bene [phone]) on 10/13/2018 9:26:04 PM       ____________________________________________  RADIOLOGY  Dg Chest 2 View  Result Date: 10/13/2018 CLINICAL DATA:  Confusion, shortness of breath. EXAM: CHEST - 2 VIEW COMPARISON:  Chest radiograph September 20, 2018 FINDINGS: Cardiomediastinal silhouette is normal. Mildly  calcified aortic arch. No pleural effusions or focal consolidations. Trachea projects midline and there is no pneumothorax. Soft tissue planes and included osseous structures are non-suspicious. Cervical facet arthropathy. IMPRESSION: Stable examination: No acute cardiopulmonary process. Aortic Atherosclerosis (ICD10-I70.0). Electronically Signed   By: Awilda Metro M.D.   On: 10/13/2018 22:24   Ct Head Wo Contrast  Result Date: 10/13/2018 CLINICAL DATA:  2 hours a confusion. EXAM: CT HEAD WITHOUT CONTRAST TECHNIQUE: Contiguous axial images were obtained from the base of the skull through the vertex without intravenous contrast. COMPARISON:  August 31, 2018 FINDINGS: Brain: Chronic white matter changes. No acute cortical ischemia or infarct identified. The most focal white matter changes in the left insula on axial image 12, unchanged. Ventricles and sulci are normal. Cerebellum, brainstem, and basal cisterns are normal. No mass effect or midline shift. No acute cortical ischemia or infarct. Vascular: Calcified atherosclerosis in the intracranial carotids. Skull: Normal. Negative for fracture or focal lesion. Sinuses/Orbits: No acute finding. Other: None. IMPRESSION: 1. Chronic white matter changes. No acute intracranial abnormalities. Electronically Signed  By: Gerome Samavid  Williams III M.D   On: 10/13/2018 22:11    ____________________________________________   PROCEDURES  Procedure(s) performed:   Procedures  None ____________________________________________   INITIAL IMPRESSION / ASSESSMENT AND PLAN / ED COURSE  Pertinent labs & imaging results that were available during my care of the patient were reviewed by me and considered in my medical decision making (see chart for details).  Patient presents to the emergency department for evaluation of confusion and word finding difficulty.  She has some difficulty for me describing where she is.  She seems to be requiring certain associations to  arrive that [facility] but cannot tell me that she is in [facility]. She would say "[facility]" but could not recall which one, the city, or the road it is on.  I do have some elevated suspicion for possible stroke.  She did have recent full work-up for this, however.  No fevers or chills to suspect infectious etiology.  Plan for CT had along with stroke work-up  11:43 PM Spoke with Dr. Jerrell BelfastAurora. Admit to [facility] and repeat MRI. Neurology can consult if MRI is positive for CVA. Will page the hospitalist. No EEG recommendation at this time.   Discussed patient's case with Hospitalist, Dr. Onalee Huaavid to request admission. Patient and family (if present) updated with plan. Care transferred to Hospitalist service.  I reviewed all nursing notes, vitals, pertinent old records, EKGs, labs, imaging (as available).  ____________________________________________  FINAL CLINICAL IMPRESSION(S) / ED DIAGNOSES  Final diagnoses:  Disorientation  Word finding difficulty    Note:  This document was prepared using Dragon voice recognition software and may include unintentional dictation errors.  Alona BeneJoshua Heddy Vidana, MD Emergency Medicine    Melynda Krzywicki, Arlyss RepressJoshua G, MD 10/13/18 [phone]

## 2018-10-14 ENCOUNTER — Inpatient Hospital Stay (HOSPITAL_COMMUNITY): Payer: Medicare Other

## 2018-10-14 ENCOUNTER — Observation Stay (HOSPITAL_COMMUNITY): Payer: Medicare Other

## 2018-10-14 ENCOUNTER — Encounter (HOSPITAL_COMMUNITY): Payer: Self-pay | Admitting: Internal Medicine

## 2018-10-14 DIAGNOSIS — G9340 Encephalopathy, unspecified: Secondary | ICD-10-CM | POA: Diagnosis present

## 2018-10-14 DIAGNOSIS — Z888 Allergy status to other drugs, medicaments and biological substances status: Secondary | ICD-10-CM | POA: Diagnosis not present

## 2018-10-14 DIAGNOSIS — R410 Disorientation, unspecified: Secondary | ICD-10-CM | POA: Diagnosis not present

## 2018-10-14 DIAGNOSIS — K219 Gastro-esophageal reflux disease without esophagitis: Secondary | ICD-10-CM | POA: Diagnosis present

## 2018-10-14 DIAGNOSIS — E876 Hypokalemia: Secondary | ICD-10-CM | POA: Diagnosis present

## 2018-10-14 DIAGNOSIS — F419 Anxiety disorder, unspecified: Secondary | ICD-10-CM | POA: Diagnosis present

## 2018-10-14 DIAGNOSIS — Z8673 Personal history of transient ischemic attack (TIA), and cerebral infarction without residual deficits: Secondary | ICD-10-CM | POA: Diagnosis not present

## 2018-10-14 DIAGNOSIS — E785 Hyperlipidemia, unspecified: Secondary | ICD-10-CM

## 2018-10-14 DIAGNOSIS — R29700 NIHSS score 0: Secondary | ICD-10-CM | POA: Diagnosis present

## 2018-10-14 DIAGNOSIS — Z87891 Personal history of nicotine dependence: Secondary | ICD-10-CM | POA: Diagnosis not present

## 2018-10-14 DIAGNOSIS — E871 Hypo-osmolality and hyponatremia: Secondary | ICD-10-CM | POA: Diagnosis present

## 2018-10-14 DIAGNOSIS — I639 Cerebral infarction, unspecified: Secondary | ICD-10-CM

## 2018-10-14 DIAGNOSIS — Z7902 Long term (current) use of antithrombotics/antiplatelets: Secondary | ICD-10-CM | POA: Diagnosis not present

## 2018-10-14 DIAGNOSIS — Z7982 Long term (current) use of aspirin: Secondary | ICD-10-CM | POA: Diagnosis not present

## 2018-10-14 DIAGNOSIS — Z79899 Other long term (current) drug therapy: Secondary | ICD-10-CM | POA: Diagnosis not present

## 2018-10-14 DIAGNOSIS — E119 Type 2 diabetes mellitus without complications: Secondary | ICD-10-CM | POA: Diagnosis present

## 2018-10-14 DIAGNOSIS — I6330 Cerebral infarction due to thrombosis of unspecified cerebral artery: Secondary | ICD-10-CM

## 2018-10-14 DIAGNOSIS — G9349 Other encephalopathy: Secondary | ICD-10-CM | POA: Diagnosis present

## 2018-10-14 DIAGNOSIS — R4701 Aphasia: Secondary | ICD-10-CM | POA: Diagnosis present

## 2018-10-14 DIAGNOSIS — I10 Essential (primary) hypertension: Secondary | ICD-10-CM | POA: Diagnosis present

## 2018-10-14 HISTORY — DX: Cerebral infarction, unspecified: I63.9

## 2018-10-14 LAB — COMPREHENSIVE METABOLIC PANEL
ALK PHOS: 34 U/L — AB (ref 38–126)
ALT: 16 U/L (ref 0–44)
ANION GAP: 9 (ref 5–15)
AST: 18 U/L (ref 15–41)
Albumin: 3.7 g/dL (ref 3.5–5.0)
BILIRUBIN TOTAL: 0.9 mg/dL (ref 0.3–1.2)
BUN: 10 mg/dL (ref 8–23)
CALCIUM: 8.6 mg/dL — AB (ref 8.9–10.3)
CO2: 23 mmol/L (ref 22–32)
Chloride: 100 mmol/L (ref 98–111)
Creatinine, Ser: 0.88 mg/dL (ref 0.44–1.00)
GFR calc Af Amer: >60 mL/min (ref >60)
Glucose, Bld: 86 mg/dL (ref 70–99)
Potassium: 3 mmol/L — ABNORMAL LOW (ref 3.5–5.1)
Sodium: 132 mmol/L — ABNORMAL LOW (ref 135–145)
TOTAL PROTEIN: 6.2 g/dL — AB (ref 6.5–8.1)

## 2018-10-14 LAB — CBC
HCT: 35.5 % — ABNORMAL LOW (ref 36.0–46.0)
HEMOGLOBIN: 12.1 g/dL (ref 12.0–15.0)
MCH: 28.1 pg (ref 26.0–34.0)
MCHC: 34.1 g/dL (ref 30.0–36.0)
MCV: 82.4 fL (ref 80.0–100.0)
Platelets: 145 10*3/uL — ABNORMAL LOW (ref 150–400)
RBC: 4.31 MIL/uL (ref 3.87–5.11)
RDW: 13.2 % (ref 11.5–15.5)
WBC: 3.6 10*3/uL — ABNORMAL LOW (ref 4.0–10.5)
nRBC: 0 % (ref 0.0–0.2)

## 2018-10-14 LAB — AMMONIA: Ammonia: 19 umol/L (ref 9–35)

## 2018-10-14 LAB — URINALYSIS, ROUTINE W REFLEX MICROSCOPIC
BACTERIA UA: NONE SEEN
Bilirubin Urine: NEGATIVE
Glucose, UA: NEGATIVE mg/dL
Hgb urine dipstick: NEGATIVE
KETONES UR: NEGATIVE mg/dL
Nitrite: NEGATIVE
PH: 7 (ref 5.0–8.0)
Protein, ur: NEGATIVE mg/dL
SPECIFIC GRAVITY, URINE: 1.012 (ref 1.005–1.030)

## 2018-10-14 LAB — TSH: TSH: 5.094 u[IU]/mL — ABNORMAL HIGH (ref 0.350–4.500)

## 2018-10-14 LAB — RPR: RPR Ser Ql: NONREACTIVE

## 2018-10-14 LAB — VITAMIN B12: VITAMIN B 12: 287 pg/mL (ref 180–914)

## 2018-10-14 MED ORDER — IOPAMIDOL (ISOVUE-370) INJECTION 76%
INTRAVENOUS | Status: AC
  Filled 2018-10-14: qty 100

## 2018-10-14 MED ORDER — SODIUM CHLORIDE 0.9 % IV SOLN
INTRAVENOUS | Status: AC
  Administered 2018-10-14: 06:00:00 via INTRAVENOUS

## 2018-10-14 MED ORDER — ENOXAPARIN SODIUM 40 MG/0.4ML ~~LOC~~ SOLN
40.0000 mg | SUBCUTANEOUS | Status: DC
  Administered 2018-10-14 – 2018-10-15 (×2): 40 mg via SUBCUTANEOUS
  Filled 2018-10-14 (×2): qty 0.4

## 2018-10-14 MED ORDER — METOPROLOL TARTRATE 25 MG PO TABS
25.0000 mg | ORAL_TABLET | Freq: Two times a day (BID) | ORAL | Status: DC
  Administered 2018-10-14: 25 mg via ORAL
  Filled 2018-10-14: qty 1

## 2018-10-14 MED ORDER — ACETAMINOPHEN 650 MG RE SUPP: 650.0000 mg | RECTAL | Status: DC | PRN

## 2018-10-14 MED ORDER — ACETAMINOPHEN 160 MG/5ML PO SOLN: 650.0000 mg | ORAL | Status: DC | PRN

## 2018-10-14 MED ORDER — CLOPIDOGREL BISULFATE 75 MG PO TABS
75.0000 mg | ORAL_TABLET | Freq: Every day | ORAL | Status: DC
  Administered 2018-10-14 – 2018-10-15 (×2): 75 mg via ORAL
  Filled 2018-10-14 (×2): qty 1

## 2018-10-14 MED ORDER — ACETAMINOPHEN 325 MG PO TABS: 650.0000 mg | ORAL_TABLET | ORAL | Status: DC | PRN

## 2018-10-14 MED ORDER — ASPIRIN EC 81 MG PO TBEC
81.0000 mg | DELAYED_RELEASE_TABLET | Freq: Every day | ORAL | Status: DC
  Administered 2018-10-15: 81 mg via ORAL
  Filled 2018-10-14: qty 1

## 2018-10-14 MED ORDER — LISINOPRIL 20 MG PO TABS
20.0000 mg | ORAL_TABLET | Freq: Every day | ORAL | Status: DC
  Administered 2018-10-14: 20 mg via ORAL
  Filled 2018-10-14 (×2): qty 1

## 2018-10-14 MED ORDER — PANTOPRAZOLE SODIUM 40 MG PO TBEC
40.0000 mg | DELAYED_RELEASE_TABLET | Freq: Every day | ORAL | Status: DC
  Administered 2018-10-14 – 2018-10-15 (×2): 40 mg via ORAL
  Filled 2018-10-14 (×2): qty 1

## 2018-10-14 MED ORDER — ATORVASTATIN CALCIUM 80 MG PO TABS
80.0000 mg | ORAL_TABLET | Freq: Every day | ORAL | Status: DC
  Administered 2018-10-14: 80 mg via ORAL
  Filled 2018-10-14: qty 1

## 2018-10-14 MED ORDER — IOPAMIDOL (ISOVUE-370) INJECTION 76%
75.0000 mL | Freq: Once | INTRAVENOUS | Status: AC | PRN
  Administered 2018-10-14: 75 mL via INTRAVENOUS

## 2018-10-14 MED ORDER — ESCITALOPRAM OXALATE 10 MG PO TABS
5.0000 mg | ORAL_TABLET | Freq: Every day | ORAL | Status: DC
  Administered 2018-10-14 – 2018-10-15 (×2): 5 mg via ORAL
  Filled 2018-10-14 (×2): qty 1

## 2018-10-14 NOTE — Evaluation (Signed)
Occupational Therapy Evaluation Patient Details Name: Ashley Buck MRN: [id number] DOB: [birth year] Today's Date: 10/14/2018    History of Present Illness Pt is a 72 y/o female admitted secondary to confusion and difficulty speaking. CT was negative, MRI pending. PMH including but not limited to HTN and anxiety.   Clinical Impression   Pt admitted with the above diagnoses and presents with below problem list. Pt will benefit from continued acute OT to address the below listed deficits and maximize independence with basic ADLs prior to d/c home. PTA pt was independent with ADLs, drives, and very active per spouse report. Pt is currently setup to min guard with ADLs. Presents with decreased cognition (vs aphasia?) and decreased activity tolerance impacting safety with ADLs. Will benefit from speech consult and continued therapy. Pt reporting dizziness OOB, resolved once back in bed at end of session. Vitals assessed at end of session: bp 138/70, 98 O2 on RA, HR 53.      Follow Up Recommendations  Outpatient OT;Supervision/Assistance - 24 hour    Equipment Recommendations  None recommended by OT    Recommendations for Other Services Speech consult     Precautions / Restrictions Precautions Precautions: None Restrictions Weight Bearing Restrictions: No      Mobility Bed Mobility Overal bed mobility: Modified Independent                Transfers Overall transfer level: Needs assistance Equipment used: None Transfers: Sit to/from Stand Sit to Stand: Supervision         General transfer comment: supervision for safety    Balance Overall balance assessment: Needs assistance Sitting-balance support: Feet supported Sitting balance-Leahy Scale: Good     Standing balance support: During functional activity;No upper extremity supported Standing balance-Leahy Scale: Fair Standing balance comment: seeking external support during dynamic standing tasks, + dizziness                            ADL either performed or assessed with clinical judgement   ADL Overall ADL's : Needs assistance/impaired Eating/Feeding: Set up;Supervision/ safety   Grooming: Set up;Supervision/safety;Standing   Upper Body Bathing: Set up;Sitting   Lower Body Bathing: Min guard;Sit to/from stand   Upper Body Dressing : Set up;Sitting;Supervision/safety   Lower Body Dressing: Min guard;Sit to/from stand   Toilet Transfer: Min guard;Ambulation;Comfort height toilet;Grab bars Toilet Transfer Details (indicate cue type and reason): + dizziness Toileting- Clothing Manipulation and Hygiene: Min guard;Sit to/from stand   Tub/ Shower Transfer: Supervision/safety;Ambulation   Functional mobility during ADLs: Min guard General ADL Comments: Pt completed bed mobility, ambulated to/from bathroom, toilet transfer, pericare, and grooming task standing at sink. Pt reporting dizziness OOB, noted to seek external support at times walking in the room.     Vision Baseline Vision/History: Wears glasses Patient Visual Report: No change from baseline Additional Comments: Pt able to accurately read from paper held in her hand. Denies blurriness, diplopia.     Perception     Praxis      Pertinent Vitals/Pain Pain Assessment: No/denies pain     Hand Dominance     Extremity/Trunk Assessment Upper Extremity Assessment Upper Extremity Assessment: Overall WFL for tasks assessed   Lower Extremity Assessment Lower Extremity Assessment: Defer to PT evaluation       Communication Communication Communication: Expressive difficulties   Cognition Arousal/Alertness: Awake/alert Behavior During Therapy: WFL for tasks assessed/performed;Flat affect Overall Cognitive Status: Impaired/Different from baseline Area of Impairment: Orientation;Memory;Following commands;Safety/judgement;Problem solving  Orientation Level: Place;Time   Memory: Decreased  short-term memory Following Commands: Follows one step commands consistently;Follows one step commands with increased time Safety/Judgement: Decreased awareness of deficits;Decreased awareness of safety   Problem Solving: Slow processing;Decreased initiation;Difficulty sequencing;Requires verbal cues General Comments: Cognition vs. aphasia?   General Comments  Pt and spouse reporting pt fatigue/decreased activity tolerance    Exercises     Shoulder Instructions      Home Living Family/patient expects to be discharged to:: Private residence Living Arrangements: Spouse/significant other Available Help at Discharge: Family;Available 24 hours/day Type of Home: House Home Access: Level entry     Home Layout: One level     Bathroom Shower/Tub: Tub/shower unit         Home Equipment: None          Prior Functioning/Environment Level of Independence: Independent        Comments: drives, shops, active        OT Problem List: Decreased activity tolerance;Impaired balance (sitting and/or standing);Decreased cognition;Decreased safety awareness;Decreased knowledge of use of DME or AE;Decreased knowledge of precautions      OT Treatment/Interventions: Self-care/ADL training;Therapeutic exercise;Energy conservation;DME and/or AE instruction;Therapeutic activities;Cognitive remediation/compensation;Patient/family education;Balance training    OT Goals(Current goals can be found in the care plan section) Acute Rehab OT Goals Patient Stated Goal: return home with husband OT Goal Formulation: With patient Time For Goal Achievement: 10/28/18 Potential to Achieve Goals: Good ADL Goals Pt Will Perform Grooming: with modified independence;standing Pt Will Perform Upper Body Dressing: Independently;sitting Pt Will Perform Lower Body Dressing: Independently;sit to/from stand Pt Will Perform Tub/Shower Transfer: Shower transfer;Independently;ambulating  OT Frequency: Min 2X/week    Barriers to D/C:            Co-evaluation              AM-PAC PT "6 Clicks" Daily Activity     Outcome Measure Help from another person eating meals?: None Help from another person taking care of personal grooming?: A Little Help from another person toileting, which includes using toliet, bedpan, or urinal?: None Help from another person bathing (including washing, rinsing, drying)?: A Little Help from another person to put on and taking off regular upper body clothing?: None Help from another person to put on and taking off regular lower body clothing?: None 6 Click Score: 22   End of Session    Activity Tolerance: Patient tolerated treatment well;Patient limited by fatigue Patient left: in bed;with call bell/phone within reach;with bed alarm set;with family/visitor present  OT Visit Diagnosis: Unsteadiness on feet (R26.81);Other symptoms and signs involving cognitive function;Cognitive communication deficit (R41.841)                Time: [id number] OT Time Calculation (min): 22 min Charges:  OT General Charges $OT Visit: 1 Visit OT Evaluation $OT Eval Low Complexity: 1 Low  Raynald KempKathryn Dao Mearns, OT Acute Rehabilitation Services Pager: [phone] Office: [phone]   Pilar GrammesMathews, Sujay Grundman H 10/14/2018, 12:45 PM

## 2018-10-14 NOTE — Consult Note (Signed)
NEURO HOSPITALIST  CONSULT   Requesting Physician: Dr. Randol Kern    Chief Complaint: confusion and difficulty speaking   History obtained from:  Patient / husband  HPI:                                                                                                                                         Ashley Buck is an 72 y.o. female  With PMH significant for, anxiety, TIA who presented to [facility] with c/o confusion and difficulty speaking.    Per husband he states that they were driving down the road, going to dinner when he noticed that his wife was very quiet. He began asking her questions and realized that she wa not responding appropriately. She seemed to be having trouble finding her words. He noticed her symptoms about 1800 10/13/18. Patient denies any slurred speech, facial droop, HA, numbness, tingling or any focal weakness. Denies ETOH, cigarettes or Drug abuse.  Patient states that she stopped taking her plavix on Monday.    Hospital course:  10/13/09: came to ED for evaluation of confusion; CTH: no hemorrhage BG: 110, BP:145/55 11/20: MRI: acute left basal ganglia infarct. Creatine WNL RPR: pending, Na: 132,  ammonia, TSH, B12,  are all WNL. No prior stroke history 09/21/18 admitted for TIA; stroke workup unremarkeable    Date last known well: Date: 10/14/2018 Time last known well: Time: 18:00 tPA Given: No: outside of window  Modified Rankin: Rankin Score=0 NIHSS:0    Past Medical History:  Diagnosis Date  . Depression   . GERD (gastroesophageal reflux disease)   . Stress     Past Surgical History:  Procedure Laterality Date  . BREAST EXCISIONAL BIOPSY Left 2010   benign    Family History  Family history unknown: Yes         Social History:  reports that she has quit smoking. She has never used smokeless tobacco. Her alcohol and drug histories are not on file.  Allergies:  Allergies  Allergen Reactions   . Potassium-Containing Compounds   . Sulfur     Medications:  Scheduled: . atorvastatin  80 mg Oral q1800  . enoxaparin (LOVENOX) injection  40 mg Subcutaneous Q24H  . escitalopram  5 mg Oral Daily  . lisinopril  20 mg Oral Daily  . metoprolol tartrate  25 mg Oral BID  . pantoprazole  40 mg Oral Daily   Continuous: . sodium chloride 75 mL/hr at 10/14/18 0605   ZOX:[id number] **OR** acetaminophen (TYLENOL) oral liquid 160 mg/5 mL **OR** acetaminophen   ROS:                                                                                                                                       ROS was performed and is negative except as noted in HPI   General Examination:                                                                                                      Blood pressure (!) 142/77, pulse 63, temperature 97.6 F (36.4 C), temperature source Oral, resp. rate 12, height 5\' 6"  (1.676 m), weight 65.7 kg, SpO2 98 %.  HEENT-  Normocephalic, no lesions, without obvious abnormality.  Normal external eye and conjunctiva.  Cardiovascular- S1-S2 audible, pulses palpable throughout  Lungs-no rhonchi or wheezing noted, no excessive working breathing.  Saturations within normal limits on RA Abdomen- All 4 quadrants palpated and nontender Extremities- Warm, dry and intact Musculoskeletal-no joint tenderness, deformity or swelling Skin-warm and dry, no hyperpigmentation, vitiligo, or suspicious lesions  Neurological Examination Mental Status: Alert, oriented, thought content appropriate.  Speech fluent without evidence of aphasia.  No dysarthria noted. Able to follow  commands without difficulty. Cranial Nerves: II:  Visual fields grossly normal,  III,IV, VI: ptosis not present, extra-ocular motions intact bilaterally, pupils equal, round, reactive to light  and accommodation V,VII: smile symmetric, facial light touch sensation normal bilaterally VIII: hearing normal bilaterally IX,X: uvula rises symmetrically XI: bilateral shoulder shrug XII: midline tongue extension Motor: Right : Upper extremity   5/5 Left:     Upper extremity   5/5  Lower extremity   5/5  Lower extremity   5/5 Tone and bulk:normal tone throughout; no atrophy noted Sensory:  light touch intact throughout, bilaterally Plantars: Right: downgoing   Left: downgoing Cerebellar: normal finger-to-nose, normal rapid alternating movements Gait: deferred   Lab Results: Basic Metabolic Panel: Recent Labs  Lab 10/13/18 2044 10/14/18 0551  NA 131* 132*  K 3.4* 3.0*  CL 97* 100  CO2 23 23  GLUCOSE 108* 86  BUN  15 10  CREATININE 0.82 0.88  CALCIUM 9.0 8.6*    CBC: Recent Labs  Lab 10/13/18 2044 10/14/18 0551  WBC 4.9 3.6*  NEUTROABS 3.0  --   HGB 12.6 12.1  HCT 37.4 35.5*  MCV 83.5 82.4  PLT 167 145*   CBG: Recent Labs  Lab 10/13/18 2037  GLUCAP 110*    Imaging: Dg Chest 2 View  Result Date: 10/13/2018 CLINICAL DATA:  Confusion, shortness of breath. EXAM: CHEST - 2 VIEW COMPARISON:  Chest radiograph September 20, 2018 FINDINGS: Cardiomediastinal silhouette is normal. Mildly calcified aortic arch. No pleural effusions or focal consolidations. Trachea projects midline and there is no pneumothorax. Soft tissue planes and included osseous structures are non-suspicious. Cervical facet arthropathy. IMPRESSION: Stable examination: No acute cardiopulmonary process. Aortic Atherosclerosis (ICD10-I70.0). Electronically Signed   By: Awilda Metroourtnay  Bloomer M.D.   On: 10/13/2018 22:24   Ct Head Wo Contrast  Result Date: 10/13/2018 CLINICAL DATA:  2 hours a confusion. EXAM: CT HEAD WITHOUT CONTRAST TECHNIQUE: Contiguous axial images were obtained from the base of the skull through the vertex without intravenous contrast. COMPARISON:  August 31, 2018 FINDINGS: Brain: Chronic  white matter changes. No acute cortical ischemia or infarct identified. The most focal white matter changes in the left insula on axial image 12, unchanged. Ventricles and sulci are normal. Cerebellum, brainstem, and basal cisterns are normal. No mass effect or midline shift. No acute cortical ischemia or infarct. Vascular: Calcified atherosclerosis in the intracranial carotids. Skull: Normal. Negative for fracture or focal lesion. Sinuses/Orbits: No acute finding. Other: None. IMPRESSION: 1. Chronic white matter changes. No acute intracranial abnormalities. Electronically Signed   By: Gerome Samavid  Mubarak Bevens III M.D   On: 10/13/2018 22:11   Mr Brain Wo Contrast  Result Date: 10/14/2018 CLINICAL DATA:  Altered level of consciousness. Confusion and difficulty speaking EXAM: MRI HEAD WITHOUT CONTRAST TECHNIQUE: Multiplanar, multiecho pulse sequences of the brain and surrounding structures were obtained without intravenous contrast. COMPARISON:  CT head 10/13/2018, MRI 09/21/2018 FINDINGS: Brain: Acute infarct left basal ganglia involving the globus pallidus, genu internal capsule and putamen. No other acute infarct Mild to moderate chronic microvascular ischemic changes in the white matter. Brainstem intact. Ventricle size normal. Negative for hemorrhage or mass. No midline shift. Vascular: Normal arterial flow voids Skull and upper cervical spine: Negative Sinuses/Orbits: Negative Other: None IMPRESSION: Acute infarct left basal ganglia, not present on the prior MRI. No hemorrhage Mild to moderate chronic microvascular ischemic changes throughout the cerebral white matter bilaterally. Electronically Signed   By: Marlan Palauharles  Clark M.D.   On: 10/14/2018 13:48    Valentina LucksJessica Kaiser Belluomini, MSN, NP-C [facility] [phone]   10/14/2018, 3:33 PM   Attending physician note to follow with Assessment and plan .   Assessment: 72 y.o. female With PMH significant for HTN, anxiety, TIA [facility] c/o  confusion and difficulty speaking. CTH: no hemorrhage. No TPA d/t outside of TPA window.MRI: left basal ganglia infarct. Stroke work-up was completed about 3 weeks ago. We will order a CTA, and ASA and plavix.  Impression: Stroke Risk Factors - hypertension    Recommendations: -- BP goal : Permissive HTN upto 220/110 mmHg    --MRI Brain (done) --CTA -- continue home doses of ASA 81 mg and Plavix 75mg ( tomorrow) -- High intensity Statin if LDL > 70 -- PT consult, OT consult, Speech consult --Telemetry monitoring --Frequent neuro checks --Stroke swallow screen    --please page stroke NP  Or  PA  Or MD from 8am -4 pm  as this patient from this time will be  followed by the stroke.   You can look them up on [email or web address]  Password [email or web address]

## 2018-10-14 NOTE — Plan of Care (Signed)
Patient is alert to self and oriented to person and place. Her husband is at the bedside. Due to the patient's newly confused state, her husband assists with educational needs. He says the patient usually learns via various avenues at baseline. Will continue to monitor patient and family for educational needs.

## 2018-10-14 NOTE — ED Notes (Signed)
Report attempted. RN to call back.

## 2018-10-14 NOTE — Progress Notes (Signed)
NURSING PROGRESS NOTE  [name] [id number] Admission Data: 10/14/2018 3:00 AM Attending Provider: Haydee Monicaavid, Rachal A, MD JYN:[id number]:Griffin, Consuella LoseElaine, MD Code Status: Full  Dia SitterJeanette S Buck is a 72 y.o. female patient admitted from ED:  -No acute distress noted.  -No complaints of shortness of breath.  -No complaints of chest pain.   Cardiac Monitoring: Box # 01 in place. Cardiac monitor yields:sinus bradycardia.  Blood pressure (!) 147/77, pulse 61, temperature (!) 97.5 F (36.4 C), temperature source Oral, resp. rate 12, height 5\' 6"  (1.676 m), weight 65.7 kg, SpO2 97 %.   IV Fluids:  IV in place, occlusive dsg intact without redness, IV cath antecubital right, condition patent and no redness none.   Allergies:  Potassium-containing compounds and Sulfur  Past Medical History:   has a past medical history of Depression, GERD (gastroesophageal reflux disease), and Stress.  Past Surgical History:   has a past surgical history that includes Breast excisional biopsy (Left, 2010).  Social History:   reports that she has quit smoking. She has never used smokeless tobacco.  Skin: patient's skin is clean, dry and intact.   Patient/Family orientated to room. Information packet given to patient/family. Admission inpatient armband information verified with patient/family to include name and date of birth and placed on patient arm. Side rails up x 2, fall assessment and education completed with patient/family. Patient/family able to verbalize understanding of risk associated with falls and verbalized understanding to call for assistance before getting out of bed. Call light within reach. Patient/family able to voice and demonstrate understanding of unit orientation instructions.    Notified [facility] Admits via Amnion that the have arrived to the unit with her husband at her bedside. Md. Toniann FailKakrakandy returned paged and said he will come assess the patient. Informed patient's husband.   Will  continue to evaluate and treat per MD orders.

## 2018-10-14 NOTE — Evaluation (Signed)
Clinical/Bedside Swallow Evaluation Patient Details  Name: Ashley Buck MRN: [id number] Date of Birth: 02/05/1946  Today's Date: 10/14/2018 Time: SLP Start Time (ACUTE ONLY): 1617 SLP Stop Time (ACUTE ONLY): 1630 SLP Time Calculation (min) (ACUTE ONLY): 13 min  Past Medical History:  Past Medical History:  Diagnosis Date  . Depression   . GERD (gastroesophageal reflux disease)   . Stress    Past Surgical History:  Past Surgical History:  Procedure Laterality Date  . BREAST EXCISIONAL BIOPSY Left 2010   benign   HPI:  Pt is a 72 y/o female admitted secondary to confusion and difficulty speaking. CT was negative, MRI showed an acute L basal ganglia infarct. PMH including but not limited to GERD, HTN, and anxiety.    Assessment / Plan / Recommendation Clinical Impression  Pt's swallow appears functional but she does have a delayed cough noted after intake. She had initially passed the swallow screen but had not been given much PO except for meds. Husband denies any coughing when taking these with thin liquids. Recommend resuming unrestricted diet but with SLP f/u - MBS may be indicated if there are any other possible signs of dysphagia. SLP Visit Diagnosis: Dysphagia, unspecified (R13.10)    Aspiration Risk  Mild aspiration risk    Diet Recommendation Regular;Thin liquid   Liquid Administration via: Cup;Straw Medication Administration: Whole meds with liquid Supervision: Patient able to self feed;Full supervision/cueing for compensatory strategies Compensations: Slow rate;Small sips/bites Postural Changes: Seated upright at 90 degrees    Other  Recommendations Oral Care Recommendations: Oral care BID   Follow up Recommendations Home health SLP;24 hour supervision/assistance      Frequency and Duration min 2x/week  2 weeks       Prognosis Prognosis for Safe Diet Advancement: Good Barriers to Reach Goals: Cognitive deficits;Language deficits      Swallow Study    General HPI: Pt is a 72 y/o female admitted secondary to confusion and difficulty speaking. CT was negative, MRI showed an acute L basal ganglia infarct. PMH including but not limited to GERD, HTN, and anxiety.  Type of Study: Bedside Swallow Evaluation Previous Swallow Assessment: none in chart Diet Prior to this Study: Dysphagia 1 (puree);Nectar-thick liquids Temperature Spikes Noted: No Respiratory Status: Room air History of Recent Intubation: No Behavior/Cognition: Alert;Cooperative;Distractible Oral Cavity Assessment: Within Functional Limits Oral Care Completed by SLP: No Oral Cavity - Dentition: Adequate natural dentition Vision: Functional for self-feeding Self-Feeding Abilities: Able to feed self Patient Positioning: Upright in bed Baseline Vocal Quality: Normal Volitional Cough: Weak Volitional Swallow: Able to elicit    Oral/Motor/Sensory Function Overall Oral Motor/Sensory Function: Within functional limits   Ice Chips Ice chips: Not tested   Thin Liquid Thin Liquid: Impaired Presentation: Cup;Self Fed;Straw Pharyngeal  Phase Impairments: Cough - Delayed    Nectar Thick Nectar Thick Liquid: Not tested   Honey Thick Honey Thick Liquid: Not tested   Puree Puree: Within functional limits Presentation: Self Fed;Spoon   Solid     Solid: Within functional limits Presentation: Self Ashley Buck      Ashley Buck 10/14/2018,5:22 PM   Ashley HamLaura Buck, M.A. Buck Acute Herbalistehabilitation Services Pager [phone] Office [phone]

## 2018-10-14 NOTE — Evaluation (Signed)
Speech Language Pathology Evaluation Patient Details Name: Ashley Buck MRN: [id number] DOB: 04/06/1946 Today's Date: 10/14/2018 Time: 1630-1700 SLP Time Calculation (min) (ACUTE ONLY): 30 min  Problem List:  Patient Active Problem List   Diagnosis Date Noted  . Acute encephalopathy 10/14/2018  . Cerebral thrombosis with cerebral infarction 10/14/2018  . Acute CVA (cerebrovascular accident) (HCC) 10/14/2018  . Aphasia 10/13/2018  . Essential hypertension 09/21/2018  . Anxiety 09/21/2018  . Hyponatremia 09/21/2018  . TIA (transient ischemic attack) 09/20/2018  . Porokeratosis 10/24/2015  . Ingrown nail 10/24/2015  . Pain in lower limb 10/24/2015  . Onychomycosis 10/24/2015   Past Medical History:  Past Medical History:  Diagnosis Date  . Depression   . GERD (gastroesophageal reflux disease)   . Stress    Past Surgical History:  Past Surgical History:  Procedure Laterality Date  . BREAST EXCISIONAL BIOPSY Left 2010   benign   HPI:  Pt is a 72 y/o female admitted secondary to confusion and difficulty speaking. CT was negative, MRI showed an acute L basal ganglia infarct. PMH including but not limited to GERD, HTN, and anxiety.    Assessment / Plan / Recommendation Clinical Impression  Pt has significantly impaired sustained attention, that may be contributing to her impaired verbal output, characterized by word-finding issues in conversation and decreased thought organization. It also impacts her problems olving abilities, even with functional tasks that are familiar. Pt would benefit from intensive SLP f/u upon discharge. Recommend 24/7 supervision and HH SLP initially to establish safe home environment with transition to OP SLP as she progresses. Her husband is very involved and asked questions about her care.     SLP Assessment  SLP Recommendation/Assessment: Patient needs continued Speech Lanaguage Pathology Services SLP Visit Diagnosis: Cognitive communication  deficit (R41.841)    Follow Up Recommendations  Home health SLP;24 hour supervision/assistance    Frequency and Duration min 2x/week  2 weeks      SLP Evaluation Cognition  Overall Cognitive Status: Impaired/Different from baseline Arousal/Alertness: Awake/alert Orientation Level: Oriented to person;Oriented to place;Disoriented to situation Attention: Sustained Sustained Attention: Impaired Sustained Attention Impairment: Verbal basic;Functional basic Memory: Impaired Memory Impairment: Decreased recall of new information Awareness: Impaired Awareness Impairment: Intellectual impairment;Emergent impairment;Anticipatory impairment Problem Solving: Impaired Problem Solving Impairment: Verbal basic;Functional basic Safety/Judgment: Impaired       Comprehension  Auditory Comprehension Overall Auditory Comprehension: Impaired Yes/No Questions: Impaired Basic Biographical Questions: 76-100% accurate Basic Immediate Environment Questions: 75-100% accurate Complex Questions: 50-74% accurate Commands: Impaired One Step Basic Commands: 75-100% accurate Complex Commands: 50-74% accurate    Expression Expression Primary Mode of Expression: Verbal Verbal Expression Overall Verbal Expression: Impaired Initiation: No impairment Level of Generative/Spontaneous Verbalization: Phrase Naming: Impairment Confrontation: Within functional limits Other Naming Comments: anomia in conversation Interfering Components: Attention   Oral / Motor  Oral Motor/Sensory Function Overall Oral Motor/Sensory Function: Within functional limits Motor Speech Overall Motor Speech: Appears within functional limits for tasks assessed   GO                    Maxcine Hamaiewonsky, Sieara Bremer 10/14/2018, 5:27 PM   Maxcine HamLaura [name], M.A. CCC-SLP Acute Herbalistehabilitation Services Pager [phone] Office [phone]

## 2018-10-14 NOTE — H&P (Addendum)
History and Physical    Ashley Buck:[id number] DOB: August 22, 1946 DOA: 10/13/2018  PCP: Maurice Small, MD  Patient coming from: Home.  Chief Complaint: Confusion and difficulty speaking.  History obtained from patient's husband.  [name] is a 72 y.o. female with history of hypertension and anxiety who was recently admitted and discharged on September 21, 2018 3 weeks ago after being admitted for possible TIA at that time work-up was largely unremarkable was found to be confused last evening while traveling with her husband in the car.  As per the patient has been there was going in the car when patient was not able to bring her clearly any words and was not able to recognize her and things and not able to play with the iPhone feet she usually handles well.  Given the restaurant and over that patient was becoming very confused and at that point decided to come to the ER.  Patient initially refused but after patient's son insisted patient was brought to the ER.  As per patient's husband patient's Effexor was discontinued and patient was started on escitalopram by patient's primary care physician who days ago.  Patient's Plavix also was discontinued.  ED Course: In the ER admits in [facility] patient was found to be confused but otherwise nonfocal.  Patient has some difficulty bringing out words and daily collecting things.  CT head was unremarkable.  ER physician discussed with Dr. Wilford Corner on-call neurologist who advised her admit for observation get MRI brain.  On my exam patient is alert awake oriented to name and place moves all extremities 5 x 5 no facial asymmetry pupils are equal and reacting to light.  Patient is afebrile denies any headache.  Review of Systems: As per HPI, rest all negative.   Past Medical History:  Diagnosis Date  . Depression   . GERD (gastroesophageal reflux disease)   . Stress     Past Surgical History:  Procedure Laterality Date  .  BREAST EXCISIONAL BIOPSY Left 2010   benign     reports that she has quit smoking. She has never used smokeless tobacco. Her alcohol and drug histories are not on file.  Allergies  Allergen Reactions  . Potassium-Containing Compounds   . Sulfur     Family History  Family history unknown: Yes    Prior to Admission medications   Medication Sig Start Date End Date Taking? Authorizing Provider  aspirin EC 81 MG EC tablet Take 1 tablet (81 mg total) by mouth daily. 09/22/18   Marinda Elk, MD  atorvastatin (LIPITOR) 80 MG tablet Take 1 tablet (80 mg total) by mouth daily at 6 PM. 09/21/18   Marinda Elk, MD  clopidogrel (PLAVIX) 75 MG tablet Take 1 tablet (75 mg total) by mouth daily. 09/22/18   Marinda Elk, MD  metoprolol tartrate (LOPRESSOR) 25 MG tablet Take 1 tablet (25 mg total) by mouth 2 (two) times daily. 09/21/18   Marinda Elk, MD  metroNIDAZOLE (METROCREAM) 0.75 % cream Apply 1 application topically 2 (two) times daily as needed (irritation).  07/22/18   [provider]  omeprazole (PRILOSEC) 20 MG capsule 1 CAPSULE TWICE A DAY ORALLY 90 DAYS 09/21/18   Marinda Elk, MD  quinapril-hydrochlorothiazide (ACCURETIC) 20-25 MG tablet Take 1 tablet by mouth daily after breakfast.  08/24/15   [provider]  raNITIdine HCl (ACID REDUCER PO) Take 1 tablet by mouth daily.    [provider]  venlafaxine  XR (EFFEXOR-XR) 37.5 MG 24 hr capsule 37.5 mg daily with breakfast.  09/07/15   [provider]    Physical Exam: Vitals:   10/13/18 2130 10/13/18 2325 10/14/18 0000 10/14/18 0145  BP: (!) 149/71 (!) 155/75 (!) 157/76 (!) 147/77  Pulse: (!) 53 (!) 56 (!) 56 61  Resp: 17 13 12    Temp:    (!) 97.5 F (36.4 C)  TempSrc:    Oral  SpO2: 100% 100% 96% 97%  Weight:      Height:          Constitutional: Moderately built and nourished. Vitals:   10/13/18 2130 10/13/18 2325 10/14/18 0000 10/14/18 0145  BP:  (!) 149/71 (!) 155/75 (!) 157/76 (!) 147/77  Pulse: (!) 53 (!) 56 (!) 56 61  Resp: 17 13 12    Temp:    (!) 97.5 F (36.4 C)  TempSrc:    Oral  SpO2: 100% 100% 96% 97%  Weight:      Height:       Eyes: Anicteric no pallor. ENMT: No discharge from the ears eyes nose or mouth. Neck: No mass felt.  No neck rigidity but no JVD appreciated. Respiratory: No rhonchi or crepitations. Cardiovascular: S1-S2 heard no murmurs appreciated. Abdomen: Soft nontender bowel sounds present. Musculoskeletal: No edema.  No joint effusion. Skin: No rash. Neurologic: Alert awake oriented to name and place moves all extremities 5 x 5 home facial extremity tongue is midline.  Pupils are equal and reacting to light. Psychiatric: Mildly confused.   Labs on Admission: I have personally reviewed following labs and imaging studies  CBC: Recent Labs  Lab 10/13/18 2044  WBC 4.9  NEUTROABS 3.0  HGB 12.6  HCT 37.4  MCV 83.5  PLT 167   Basic Metabolic Panel: Recent Labs  Lab 10/13/18 2044  NA 131*  K 3.4*  CL 97*  CO2 23  GLUCOSE 108*  BUN 15  CREATININE 0.82  CALCIUM 9.0   GFR: Estimated Creatinine Clearance: 56.3 mL/min (by C-G formula based on SCr of 0.82 mg/dL). Liver Function Tests: Recent Labs  Lab 10/13/18 2044  AST 21  ALT 16  ALKPHOS 42  BILITOT 0.7  PROT 7.0  ALBUMIN 4.2   No results for input(s): LIPASE, AMYLASE in the last 168 hours. No results for input(s): AMMONIA in the last 168 hours. Coagulation Profile: Recent Labs  Lab 10/13/18 2044  INR 0.99   Cardiac Enzymes: Recent Labs  Lab 10/13/18 2044  TROPONINI <0.03   BNP (last 3 results) No results for input(s): PROBNP in the last 8760 hours. HbA1C: No results for input(s): HGBA1C in the last 72 hours. CBG: Recent Labs  Lab 10/13/18 2037  GLUCAP 110*   Lipid Profile: No results for input(s): CHOL, HDL, LDLCALC, TRIG, CHOLHDL, LDLDIRECT in the last 72 hours. Thyroid Function Tests: No results for  input(s): TSH, T4TOTAL, FREET4, T3FREE, THYROIDAB in the last 72 hours. Anemia Panel: No results for input(s): VITAMINB12, FOLATE, FERRITIN, TIBC, IRON, RETICCTPCT in the last 72 hours. Urine analysis:    Component Value Date/Time   COLORURINE STRAW (A) 09/20/2018 2007   APPEARANCEUR CLEAR 09/20/2018 2007   LABSPEC 1.006 09/20/2018 2007   PHURINE 7.0 09/20/2018 2007   GLUCOSEU NEGATIVE 09/20/2018 2007   HGBUR SMALL (A) 09/20/2018 2007   BILIRUBINUR NEGATIVE 09/20/2018 2007   KETONESUR NEGATIVE 09/20/2018 2007   PROTEINUR NEGATIVE 09/20/2018 2007   NITRITE NEGATIVE 09/20/2018 2007   LEUKOCYTESUR NEGATIVE 09/20/2018 2007   Sepsis Labs: @LABRCNTIP (procalcitonin:4,lacticidven:4) )No  results found for this or any previous visit (from the past 240 hour(s)).   Radiological Exams on Admission: Dg Chest 2 View  Result Date: 10/13/2018 CLINICAL DATA:  Confusion, shortness of breath. EXAM: CHEST - 2 VIEW COMPARISON:  Chest radiograph September 20, 2018 FINDINGS: Cardiomediastinal silhouette is normal. Mildly calcified aortic arch. No pleural effusions or focal consolidations. Trachea projects midline and there is no pneumothorax. Soft tissue planes and included osseous structures are non-suspicious. Cervical facet arthropathy. IMPRESSION: Stable examination: No acute cardiopulmonary process. Aortic Atherosclerosis (ICD10-I70.0). Electronically Signed   By: Awilda Metro M.D.   On: 10/13/2018 22:24   Ct Head Wo Contrast  Result Date: 10/13/2018 CLINICAL DATA:  2 hours a confusion. EXAM: CT HEAD WITHOUT CONTRAST TECHNIQUE: Contiguous axial images were obtained from the base of the skull through the vertex without intravenous contrast. COMPARISON:  August 31, 2018 FINDINGS: Brain: Chronic white matter changes. No acute cortical ischemia or infarct identified. The most focal white matter changes in the left insula on axial image 12, unchanged. Ventricles and sulci are normal. Cerebellum, brainstem,  and basal cisterns are normal. No mass effect or midline shift. No acute cortical ischemia or infarct. Vascular: Calcified atherosclerosis in the intracranial carotids. Skull: Normal. Negative for fracture or focal lesion. Sinuses/Orbits: No acute finding. Other: None. IMPRESSION: 1. Chronic white matter changes. No acute intracranial abnormalities. Electronically Signed   By: Gerome Sam III M.D   On: 10/13/2018 22:11    EKG: Independently reviewed.  Normal sinus rhythm.  Assessment/Plan Principal Problem:   Acute encephalopathy Active Problems:   Essential hypertension   Hyponatremia   Aphasia    1. Acute encephalopathy with difficulty bringing out words -Per neurologist patient to get MRI brain and if positive for stroke then further neurology work-up and neurology consult.  Check ammonia level TSH and also will get dementia work-up with B12 folate and RPR. 2. Hypertension on Quinilapril and metoprolol.  Will hold hydrochlorothiazide due to low sodium levels.  Follow blood pressure trends. 3. Anxiety on escitalopram which was just recently started. 4. Mild hyponatremia hold hydrochlorothiazide follow metabolic panel on gentle hydration.  Patient's urine analysis is pending.   DVT prophylaxis: Lovenox. Code Status: Full code. Family Communication: Discussed with patient's husband. Disposition Plan: Home. Consults called: None. Admission status: Observation.   Eduard Clos MD [facility] Pager [phone].  If 7PM-7AM, please contact night-coverage [email or web address] Password [facility]  10/14/2018, 5:17 AM

## 2018-10-14 NOTE — Plan of Care (Signed)
  Problem: Education: Goal: Knowledge of General Education information will improve Description Including pain rating scale, medication(s)/side effects and non-pharmacologic comfort measures Outcome: Progressing   Problem: Clinical Measurements: Goal: Ability to maintain clinical measurements within normal limits will improve Outcome: Progressing Goal: Will remain free from infection Outcome: Progressing Note:  Pt has shown no signs of infection during my care.   Pt has been withdrawn during my shift. She will respond to questions and smile when interacted with. Pt is alert to self and place.

## 2018-10-14 NOTE — Evaluation (Addendum)
Physical Therapy Evaluation Patient Details Name: Ashley Buck MRN: [id number] DOB: February 23, 1946 Today's Date: 10/14/2018   History of Present Illness  Pt is a 72 y/o female admitted secondary to confusion and difficulty speaking. CT was negative, MRI pending. PMH including but not limited to HTN and anxiety.    Clinical Impression  Pt presented supine in bed with HOB elevated, awake and willing to participate in therapy session. Pt's spouse present throughout as well. Prior to admission, pt reported that she was independent with all functional mobility and ADLs. Pt lives with her husband in a single level home with a level entry. Pt is currently able to perform bed mobility at modified independence, transfers with supervision and ambulated in hallway with min guard without use of an AD. Attempted to have pt participate in higher level balance assessment (DGI) but unsuccessful due to cognitive deficits (see below). PT will continue to follow acutely to progress mobility as tolerated.     Follow Up Recommendations Home Health PT;Supervision/Assistance - 24 hour    Equipment Recommendations  None recommended by PT    Recommendations for Other Services       Precautions / Restrictions Precautions Precautions: None Restrictions Weight Bearing Restrictions: No      Mobility  Bed Mobility Overal bed mobility: Modified Independent                Transfers Overall transfer level: Needs assistance Equipment used: None Transfers: Sit to/from Stand Sit to Stand: Supervision         General transfer comment: supervision for safety  Ambulation/Gait Ambulation/Gait assistance: Min guard Gait Distance (Feet): 200 Feet Assistive device: None Gait Pattern/deviations: Step-through pattern;Decreased stride length Gait velocity: decreased   General Gait Details: pt with mild instability with navigating around obstacles in hallway but no overt LOB or need for physical  assistance, min guard for safety  Stairs            Wheelchair Mobility    Modified Rankin (Stroke Patients Only) Modified Rankin (Stroke Patients Only) Pre-Morbid Rankin Score: No symptoms Modified Rankin: Slight disability     Balance Overall balance assessment: Needs assistance Sitting-balance support: Feet supported Sitting balance-Leahy Scale: Good     Standing balance support: During functional activity;No upper extremity supported Standing balance-Leahy Scale: Fair                               Pertinent Vitals/Pain Pain Assessment: No/denies pain    Home Living Family/patient expects to be discharged to:: Private residence Living Arrangements: Spouse/significant other Available Help at Discharge: Family;Available 24 hours/day Type of Home: House Home Access: Level entry     Home Layout: One level Home Equipment: None      Prior Function Level of Independence: Independent               Hand Dominance        Extremity/Trunk Assessment   Upper Extremity Assessment Upper Extremity Assessment: Overall WFL for tasks assessed    Lower Extremity Assessment Lower Extremity Assessment: Overall WFL for tasks assessed       Communication   Communication: Expressive difficulties  Cognition Arousal/Alertness: Awake/alert Behavior During Therapy: WFL for tasks assessed/performed Overall Cognitive Status: Impaired/Different from baseline Area of Impairment: Orientation;Memory;Following commands;Safety/judgement;Problem solving                 Orientation Level: Disoriented to;Place   Memory: Decreased short-term memory Following Commands: Follows  one step commands consistently;Follows one step commands with increased time Safety/Judgement: Decreased awareness of deficits;Decreased awareness of safety   Problem Solving: Slow processing;Decreased initiation;Difficulty sequencing;Requires verbal cues        General Comments       Exercises     Assessment/Plan    PT Assessment Patient needs continued PT services  PT Problem List Decreased balance;Decreased mobility;Decreased coordination;Decreased cognition;Decreased safety awareness       PT Treatment Interventions DME instruction;Gait training;Stair training;Functional mobility training;Therapeutic activities;Therapeutic exercise;Balance training;Neuromuscular re-education;Patient/family education;Cognitive remediation    PT Goals (Current goals can be found in the Care Plan section)  Acute Rehab PT Goals Patient Stated Goal: return home with husband PT Goal Formulation: With patient/family Time For Goal Achievement: 10/28/18 Potential to Achieve Goals: Good    Frequency Min 4X/week   Barriers to discharge        Co-evaluation               AM-PAC PT "6 Clicks" Daily Activity  Outcome Measure Difficulty turning over in bed (including adjusting bedclothes, sheets and blankets)?: None Difficulty moving from lying on back to sitting on the side of the bed? : None Difficulty sitting down on and standing up from a chair with arms (e.g., wheelchair, bedside commode, etc,.)?: A Little Help needed moving to and from a bed to chair (including a wheelchair)?: None Help needed walking in hospital room?: A Little Help needed climbing 3-5 steps with a railing? : A Little 6 Click Score: 21    End of Session Equipment Utilized During Treatment: Gait belt Activity Tolerance: Patient tolerated treatment well Patient left: in bed;with call bell/phone within reach;with family/visitor present Nurse Communication: Mobility status PT Visit Diagnosis: Other symptoms and signs involving the nervous system (R29.898)    Time: [id number] PT Time Calculation (min) (ACUTE ONLY): 23 min   Charges:   PT Evaluation $PT Eval Moderate Complexity: 1 Mod PT Treatments $Gait Training: 8-22 mins        Deborah ChalkJennifer Jenai Scaletta, PT, DPT  Acute Rehabilitation  Services Pager [phone] Office [phone]    Alessandra BevelsJennifer M Lautaro Koral 10/14/2018, 12:10 PM

## 2018-10-14 NOTE — Progress Notes (Signed)
PROGRESS NOTE                                                                                                                                                                                                             Patient Demographics:    Ashley Buck, is a 72 y.o. female, DOB - [birth year], ZOX:[id number]  Admit date - 10/13/2018   Admitting Physician Haydee Monica, MD  Outpatient Primary MD for the patient is Maurice Small, MD  LOS - 0   Chief Complaint  Patient presents with  . Altered Mental Status       Brief Narrative   This is a no charge noticed patient admitted earlier today, chart, labs and imaging were reviewed   Subjective:    Stormy Barclift today complaints, husband at bedside report patient remains significantly confused, not at baseline.   Assessment  & Plan :    Principal Problem:   Acute encephalopathy Active Problems:   Essential hypertension   Hyponatremia   Aphasia   Cerebral thrombosis with cerebral infarction   Acute CVA (cerebrovascular accident) (HCC)   Acute CVA -Patient presents with altered mental status, significant for acute CVA, recent hospitalization at [facility] along with complete TIA work-up, was on aspirin and Plavix for 3 weeks, currently only on aspirin, neurology was consulted, further management per neuro, will consult PT/OT/SLP  Hypertension -Hold meds to allow for permissive hypertension   Anxiety -Continue with home meds  Hyponatremia -Mild, hold hydrochlorothiazide   Code Status : Full  Family Communication  : Husband at bedside  Disposition Plan  : pending further work up  Consults  :  neuro  Procedures  : none  DVT Prophylaxis  :  lovenox  Lab Results  Component Value Date   PLT 145 (L) 10/14/2018    Antibiotics  :    Anti-infectives (From admission, onward)   None        Objective:   Vitals:   10/14/18 0000 10/14/18 0145 10/14/18 0551 10/14/18 0947   BP: (!) 157/76 (!) 147/77 (!) 149/75 (!) 142/77  Pulse: (!) 56 61 (!) 57 63  Resp: 12     Temp:  (!) 97.5 F (36.4 C) 97.6 F (36.4 C)   TempSrc:  Oral Oral   SpO2: 96% 97% 98%   Weight:  Height:        Wt Readings from Last 3 Encounters:  10/13/18 65.7 kg  09/20/18 65.8 kg  02/25/17 68 kg     Intake/Output Summary (Last 24 hours) at 10/14/2018 1543 Last data filed at 10/14/2018 0913 Gross per 24 hour  Intake 0 ml  Output 500 ml  Net -500 ml     Physical Exam  Awake Alert, patient is significantly confused Symmetrical Chest wall movement, Good air movement bilaterally, CTAB RRR,No Gallops,Rubs or new Murmurs, No Parasternal Heave +ve B.Sounds, Abd Soft, No tenderness,  No rebound - guarding or rigidity. No Cyanosis, Clubbing or edema, No new Rash or bruise      Data Review:    CBC Recent Labs  Lab 10/13/18 2044 10/14/18 0551  WBC 4.9 3.6*  HGB 12.6 12.1  HCT 37.4 35.5*  PLT 167 145*  MCV 83.5 82.4  MCH 28.1 28.1  MCHC 33.7 34.1  RDW 13.4 13.2  LYMPHSABS 1.3  --   MONOABS 0.5  --   EOSABS 0.1  --   BASOSABS 0.0  --     Chemistries  Recent Labs  Lab 10/13/18 2044 10/14/18 0551  NA 131* 132*  K 3.4* 3.0*  CL 97* 100  CO2 23 23  GLUCOSE 108* 86  BUN 15 10  CREATININE 0.82 0.88  CALCIUM 9.0 8.6*  AST 21 18  ALT 16 16  ALKPHOS 42 34*  BILITOT 0.7 0.9   ------------------------------------------------------------------------------------------------------------------ No results for input(s): CHOL, HDL, LDLCALC, TRIG, CHOLHDL, LDLDIRECT in the last 72 hours.  Lab Results  Component Value Date   HGBA1C 4.7 (L) 09/21/2018   ------------------------------------------------------------------------------------------------------------------ Recent Labs    10/14/18 0551  TSH 5.094*   ------------------------------------------------------------------------------------------------------------------ Recent Labs    10/14/18 0551    VITAMINB12 287    Coagulation profile Recent Labs  Lab 10/13/18 2044  INR 0.99    No results for input(s): DDIMER in the last 72 hours.  Cardiac Enzymes Recent Labs  Lab 10/13/18 2044  TROPONINI <0.03   ------------------------------------------------------------------------------------------------------------------ No results found for: BNP  Inpatient Medications  Scheduled Meds: . atorvastatin  80 mg Oral q1800  . enoxaparin (LOVENOX) injection  40 mg Subcutaneous Q24H  . escitalopram  5 mg Oral Daily  . lisinopril  20 mg Oral Daily  . metoprolol tartrate  25 mg Oral BID  . pantoprazole  40 mg Oral Daily   Continuous Infusions: . sodium chloride 75 mL/hr at 10/14/18 0605   PRN Meds:.acetaminophen **OR** acetaminophen (TYLENOL) oral liquid 160 mg/5 mL **OR** acetaminophen  Micro Results No results found for this or any previous visit (from the past 240 hour(s)).  Radiology Reports Dg Chest 2 View  Result Date: 10/13/2018 CLINICAL DATA:  Confusion, shortness of breath. EXAM: CHEST - 2 VIEW COMPARISON:  Chest radiograph September 20, 2018 FINDINGS: Cardiomediastinal silhouette is normal. Mildly calcified aortic arch. No pleural effusions or focal consolidations. Trachea projects midline and there is no pneumothorax. Soft tissue planes and included osseous structures are non-suspicious. Cervical facet arthropathy. IMPRESSION: Stable examination: No acute cardiopulmonary process. Aortic Atherosclerosis (ICD10-I70.0). Electronically Signed   By: Awilda Metro M.D.   On: 10/13/2018 22:24   Dg Chest 2 View  Result Date: 09/20/2018 CLINICAL DATA:  Tingling and numbness left base since last night. EXAM: CHEST - 2 VIEW COMPARISON:  04/04/2005 FINDINGS: Lungs are adequately inflated without focal airspace consolidation or effusion. Cardiomediastinal silhouette, bones and soft tissues are unchanged. IMPRESSION: No active cardiopulmonary disease. Electronically Signed  By:  Elberta Fortisaniel  Boyle M.D.   On: 09/20/2018 23:38   Ct Head Wo Contrast  Result Date: 10/13/2018 CLINICAL DATA:  2 hours a confusion. EXAM: CT HEAD WITHOUT CONTRAST TECHNIQUE: Contiguous axial images were obtained from the base of the skull through the vertex without intravenous contrast. COMPARISON:  August 31, 2018 FINDINGS: Brain: Chronic white matter changes. No acute cortical ischemia or infarct identified. The most focal white matter changes in the left insula on axial image 12, unchanged. Ventricles and sulci are normal. Cerebellum, brainstem, and basal cisterns are normal. No mass effect or midline shift. No acute cortical ischemia or infarct. Vascular: Calcified atherosclerosis in the intracranial carotids. Skull: Normal. Negative for fracture or focal lesion. Sinuses/Orbits: No acute finding. Other: None. IMPRESSION: 1. Chronic white matter changes. No acute intracranial abnormalities. Electronically Signed   By: Gerome Samavid  Williams III M.D   On: 10/13/2018 22:11   Ct Head Wo Contrast  Result Date: 09/20/2018 CLINICAL DATA:  Tingling and numbness of the face lips and left hand last evening and again this afternoon. EXAM: CT HEAD WITHOUT CONTRAST TECHNIQUE: Contiguous axial images were obtained from the base of the skull through the vertex without intravenous contrast. COMPARISON:  None. FINDINGS: Brain: Age-indeterminate small vessel ischemic disease of periventricular white matter. No hydrocephalus, intra-axial mass nor extra-axial fluid collections. No hemorrhage, large vascular territory infarct, midline shift or edema. Midline fourth ventricle and basal cisterns. Vascular: No hyperdense vessel sign. Atherosclerosis of the carotid siphons. Skull: Negative Sinuses/Orbits: Nonacute Other: None IMPRESSION: Age-indeterminate small vessel ischemic disease of periventricular white matter. Electronically Signed   By: Tollie Ethavid  Kwon M.D.   On: 09/20/2018 19:38   Mr Brain Wo Contrast  Result Date:  10/14/2018 CLINICAL DATA:  Altered level of consciousness. Confusion and difficulty speaking EXAM: MRI HEAD WITHOUT CONTRAST TECHNIQUE: Multiplanar, multiecho pulse sequences of the brain and surrounding structures were obtained without intravenous contrast. COMPARISON:  CT head 10/13/2018, MRI 09/21/2018 FINDINGS: Brain: Acute infarct left basal ganglia involving the globus pallidus, genu internal capsule and putamen. No other acute infarct Mild to moderate chronic microvascular ischemic changes in the white matter. Brainstem intact. Ventricle size normal. Negative for hemorrhage or mass. No midline shift. Vascular: Normal arterial flow voids Skull and upper cervical spine: Negative Sinuses/Orbits: Negative Other: None IMPRESSION: Acute infarct left basal ganglia, not present on the prior MRI. No hemorrhage Mild to moderate chronic microvascular ischemic changes throughout the cerebral white matter bilaterally. Electronically Signed   By: Marlan Palauharles  Clark M.D.   On: 10/14/2018 13:48   Mr Brain Wo Contrast  Result Date: 09/21/2018 CLINICAL DATA:  72 y/o F; tingling and numbness of the left face, lips, and left hand last night and again this afternoon. TIA, initial exam. EXAM: MRI HEAD WITHOUT CONTRAST MRA HEAD WITHOUT CONTRAST TECHNIQUE: Multiplanar, multiecho pulse sequences of the brain and surrounding structures were obtained without intravenous contrast. Angiographic images of the head were obtained using MRA technique without contrast. COMPARISON:  09/20/2018 CT head. FINDINGS: MRI HEAD FINDINGS Brain: No acute infarction, hemorrhage, hydrocephalus, extra-axial collection or mass lesion. Several nonspecific T2 FLAIR hyperintensities in subcortical and periventricular white matter are compatible with moderate chronic microvascular ischemic changes. Moderate volume loss of the brain. Vascular: As below. Skull and upper cervical spine: Normal marrow signal. Sinuses/Orbits: Negative. Other: None. MRA HEAD  FINDINGS Internal carotid arteries:  Patent. Anterior cerebral arteries:  Patent. Middle cerebral arteries: Patent. Anterior communicating artery: Patent. Posterior communicating arteries: Probable small left posterior communicating artery. No right  posterior communicating artery identified, likely hypoplastic or absent. Posterior cerebral arteries:  Patent. Basilar artery:  Patent. Vertebral arteries:  Patent.  Right dominant. No evidence of high-grade stenosis, large vessel occlusion, or aneurysm. IMPRESSION: 1. No acute intracranial abnormality identified. 2. Moderate chronic microvascular ischemic changes and volume loss of the brain. 3. Patent anterior and posterior intracranial circulation. No large vessel occlusion, aneurysm, or significant stenosis. Electronically Signed   By: Mitzi Hansen M.D.   On: 09/21/2018 07:00   Mr Shirlee Latch ZO Contrast  Result Date: 09/21/2018 CLINICAL DATA:  72 y/o F; tingling and numbness of the left face, lips, and left hand last night and again this afternoon. TIA, initial exam. EXAM: MRI HEAD WITHOUT CONTRAST MRA HEAD WITHOUT CONTRAST TECHNIQUE: Multiplanar, multiecho pulse sequences of the brain and surrounding structures were obtained without intravenous contrast. Angiographic images of the head were obtained using MRA technique without contrast. COMPARISON:  09/20/2018 CT head. FINDINGS: MRI HEAD FINDINGS Brain: No acute infarction, hemorrhage, hydrocephalus, extra-axial collection or mass lesion. Several nonspecific T2 FLAIR hyperintensities in subcortical and periventricular white matter are compatible with moderate chronic microvascular ischemic changes. Moderate volume loss of the brain. Vascular: As below. Skull and upper cervical spine: Normal marrow signal. Sinuses/Orbits: Negative. Other: None. MRA HEAD FINDINGS Internal carotid arteries:  Patent. Anterior cerebral arteries:  Patent. Middle cerebral arteries: Patent. Anterior communicating artery:  Patent. Posterior communicating arteries: Probable small left posterior communicating artery. No right posterior communicating artery identified, likely hypoplastic or absent. Posterior cerebral arteries:  Patent. Basilar artery:  Patent. Vertebral arteries:  Patent.  Right dominant. No evidence of high-grade stenosis, large vessel occlusion, or aneurysm. IMPRESSION: 1. No acute intracranial abnormality identified. 2. Moderate chronic microvascular ischemic changes and volume loss of the brain. 3. Patent anterior and posterior intracranial circulation. No large vessel occlusion, aneurysm, or significant stenosis. Electronically Signed   By: Mitzi Hansen M.D.   On: 09/21/2018 07:00   Vas US Carotid  Result Date: 09/21/2018 Carotid Arterial Duplex Study Indications:       TIA, Numbness and Weakness. Comparison Study:  No prior study available Performing Technologist: Hongying Cole  Examination Guidelines: A complete evaluation includes B-mode imaging, spectral Doppler, color Doppler, and power Doppler as needed of all accessible portions of each vessel. Bilateral testing is considered an integral part of a complete examination. Limited examinations for reoccurring indications may be performed as noted.  Right Carotid Findings: +----------+--------+--------+--------+------------------------------+--------+           PSV cm/sEDV cm/sStenosisDescribe                      Comments +----------+--------+--------+--------+------------------------------+--------+ CCA Prox  116     21              diffuse, smooth and hypoechoic         +----------+--------+--------+--------+------------------------------+--------+ CCA Distal95      22                                                     +----------+--------+--------+--------+------------------------------+--------+ ICA Prox  123     42      40-59%  diffuse and hyperechoic                 +----------+--------+--------+--------+------------------------------+--------+ ICA Distal138     45                                                     +----------+--------+--------+--------+------------------------------+--------+  ECA       110     23                                                     +----------+--------+--------+--------+------------------------------+--------+ +----------+--------+-------+--------+-------------------+           PSV cm/sEDV cmsDescribeArm Pressure (mmHG) +----------+--------+-------+--------+-------------------+ [id number]                                        +----------+--------+-------+--------+-------------------+ +---------+--------+--+--------+--+---------------+ VertebralPSV cm/s78EDV cm/s21Bi- directional +---------+--------+--+--------+--+---------------+  Left Carotid Findings: +----------+--------+--------+--------+---------------------+--------+           PSV cm/sEDV cm/sStenosisDescribe             Comments +----------+--------+--------+--------+---------------------+--------+ CCA Prox  108     20                                            +----------+--------+--------+--------+---------------------+--------+ CCA Distal95      28                                            +----------+--------+--------+--------+---------------------+--------+ ICA Prox  78      24      1-39%   focal and hyperechoic         +----------+--------+--------+--------+---------------------+--------+ ICA Distal94      31                                            +----------+--------+--------+--------+---------------------+--------+ ECA       122     13                                            +----------+--------+--------+--------+---------------------+--------+ +----------+--------+--------+--------+-------------------+ SubclavianPSV cm/sEDV cm/sDescribeArm Pressure (mmHG)  +----------+--------+--------+--------+-------------------+           135                                         +----------+--------+--------+--------+-------------------+ +---------+--------+--+--------+--+---------+ VertebralPSV cm/s38EDV cm/s12Antegrade +---------+--------+--+--------+--+---------+  Summary: Right Carotid: Velocities in the right ICA are consistent with a 40-59%                stenosis. Left Carotid: Velocities in the left ICA are consistent with a 1-39% stenosis. Vertebrals: Left vertebral artery demonstrates antegrade flow. Right vertebral             artery demonstrates bidirectional flow. *See table(s) above for measurements and observations.  Electronically signed by Waverly Ferrari MD on 09/21/2018 at 5:23:46 PM.    Final      Huey Bienenstock M.D on 10/14/2018 at 3:43 PM  Between 7am to 7pm - Pager - [phone]  After 7pm go to [email or web address] - password [email or web address]  [facility] -  Office  [phone]     

## 2018-10-15 ENCOUNTER — Encounter (HOSPITAL_COMMUNITY): Payer: Self-pay | Admitting: Primary Care

## 2018-10-15 DIAGNOSIS — I639 Cerebral infarction, unspecified: Principal | ICD-10-CM

## 2018-10-15 LAB — BASIC METABOLIC PANEL
ANION GAP: 7 (ref 5–15)
BUN: 14 mg/dL (ref 8–23)
CALCIUM: 7.9 mg/dL — AB (ref 8.9–10.3)
CO2: 22 mmol/L (ref 22–32)
CREATININE: 0.83 mg/dL (ref 0.44–1.00)
Chloride: 103 mmol/L (ref 98–111)
GFR calc Af Amer: >60 mL/min (ref >60)
GLUCOSE: 79 mg/dL (ref 70–99)
Potassium: 3.3 mmol/L — ABNORMAL LOW (ref 3.5–5.1)
Sodium: 132 mmol/L — ABNORMAL LOW (ref 135–145)

## 2018-10-15 LAB — FOLATE RBC
FOLATE, HEMOLYSATE: 325.2 ng/mL
Folate, RBC: 921 ng/mL (ref >498)
Hematocrit: 35.3 % (ref 34.0–46.6)

## 2018-10-15 LAB — CBC
HEMATOCRIT: 34.2 % — AB (ref 36.0–46.0)
Hemoglobin: 11.1 g/dL — ABNORMAL LOW (ref 12.0–15.0)
MCH: 27.5 pg (ref 26.0–34.0)
MCHC: 32.5 g/dL (ref 30.0–36.0)
MCV: 84.7 fL (ref 80.0–100.0)
PLATELETS: 148 10*3/uL — AB (ref 150–400)
RBC: 4.04 MIL/uL (ref 3.87–5.11)
RDW: 13.3 % (ref 11.5–15.5)
WBC: 4.9 10*3/uL (ref 4.0–10.5)
nRBC: 0 % (ref 0.0–0.2)

## 2018-10-15 MED ORDER — CLOPIDOGREL BISULFATE 75 MG PO TABS: 75.0000 mg | ORAL_TABLET | Freq: Every day | ORAL | 0 refills | Status: AC

## 2018-10-15 MED ORDER — ASPIRIN 81 MG PO TBEC: 81.0000 mg | DELAYED_RELEASE_TABLET | Freq: Every day | ORAL | 0 refills | Status: AC

## 2018-10-15 MED ORDER — QUINAPRIL HCL 20 MG PO TABS: 20.0000 mg | ORAL_TABLET | Freq: Two times a day (BID) | ORAL | 0 refills | Status: DC

## 2018-10-15 NOTE — Progress Notes (Signed)
  Speech Language Pathology Treatment: Dysphagia;Cognitive-Linquistic  Patient Details Name: Ashley Buck MRN: [id number] DOB: [birth year] Today's Date: 10/15/2018 Time: [id number] SLP Time Calculation (min) (ACUTE ONLY): 43 min  Assessment / Plan / Recommendation Clinical Impression  Pt is more interactive today, requiring Mod cues for word-finding and accurate recall of information during spontaneous communication for verbal expression at the phrase level. Max cues are needed for sustained attention to attempt simple divergent naming task. Pt recalled items from her dinner tray last night given Mod contextual cues. SLP provided education and training for husband, who participated in activities to better learn how to help her post-acute care stay. SLP also provided skilled observation during breakfast meal with no overt s/s of aspiration. Husband denies any difficulty last night as well. Would continue current diet for now. [facility] upon [facility].   HPI HPI: Pt is a 72 y/o female admitted secondary to confusion and difficulty speaking. CT was negative, MRI showed an acute L basal ganglia infarct. PMH including but not limited to GERD, HTN, and anxiety.       SLP Plan  Continue with current plan of care       Recommendations  Diet recommendations: Regular;Thin liquid Liquids provided via: Cup;Straw Medication Administration: Whole meds with liquid Supervision: Patient able to self feed;Full supervision/cueing for compensatory strategies Compensations: Slow rate;Small sips/bites Postural Changes and/or Swallow Maneuvers: Seated upright 90 degrees                Oral Care Recommendations: Oral care BID Follow up Recommendations: Home health SLP;24 hour supervision/assistance SLP Visit Diagnosis: Cognitive communication deficit (R41.841);Dysphagia, unspecified (R13.10) Plan: Continue with current plan of care       GO                 Maxcine Hamaiewonsky, Ashley Buck 10/15/2018, 9:29 AM  Maxcine HamLaura [name], M.A. CCC-SLP Acute Herbalistehabilitation Services Pager [phone] Office [phone]

## 2018-10-15 NOTE — Progress Notes (Addendum)
Physical Therapy Treatment Patient Details Name: Ashley Buck MRN: [id number] DOB: 04/27/1946 Today's Date: 10/15/2018    History of Present Illness Pt is a 72 y/o female admitted secondary to confusion and difficulty speaking. CT was negative, MRI revealed a L basal ganglia infarct. PMH including but not limited to HTN and anxiety.    PT Comments    Pt making steady progress with functional mobility. She continues to demonstrate mild instability with ambulation, but very cautious and careful in hallway. Pt's spouse present throughout session again and agreeable to HHPT. Pt would continue to benefit from skilled physical therapy services at this time while admitted and after d/c to address the below listed limitations in order to improve overall safety and independence with functional mobility.    Follow Up Recommendations  Supervision/Assistance - 24 hour;Home health PT     Equipment Recommendations  None recommended by PT    Recommendations for Other Services       Precautions / Restrictions Precautions Precautions: None Restrictions Weight Bearing Restrictions: No    Mobility  Bed Mobility Overal bed mobility: Modified Independent                Transfers Overall transfer level: Needs assistance Equipment used: None Transfers: Sit to/from Stand Sit to Stand: Supervision         General transfer comment: supervision for safety  Ambulation/Gait Ambulation/Gait assistance: Min guard Gait Distance (Feet): 200 Feet Assistive device: None Gait Pattern/deviations: Step-through pattern;Decreased stride length;Drifts right/left Gait velocity: decreased   General Gait Details: pt with mild instability with navigating around obstacles in hallway with minor LOB x1 during higher level balance assessment (able to self correct), min guard for safety   Stairs             Wheelchair Mobility    Modified Rankin (Stroke Patients Only) Modified Rankin  (Stroke Patients Only) Pre-Morbid Rankin Score: No symptoms Modified Rankin: Slight disability     Balance Overall balance assessment: Needs assistance Sitting-balance support: Feet supported Sitting balance-Leahy Scale: Good     Standing balance support: During functional activity;No upper extremity supported Standing balance-Leahy Scale: Fair               High level balance activites: Direction changes;Turns;Sudden stops;Head turns High Level Balance Comments: attempted to complete DGI; however, unable to accurately assess secondary to cognitive/language deficits; pt required increased time and close min guard throughout all higher level balance tests and multimodal cueing            Cognition Arousal/Alertness: Awake/alert Behavior During Therapy: WFL for tasks assessed/performed Overall Cognitive Status: Impaired/Different from baseline Area of Impairment: Orientation;Attention;Memory;Following commands;Safety/judgement;Awareness;Problem solving                 Orientation Level: Disoriented to;Situation Current Attention Level: Sustained Memory: Decreased short-term memory Following Commands: Follows one step commands with increased time Safety/Judgement: Decreased awareness of safety;Decreased awareness of deficits Awareness: Emergent Problem Solving: Slow processing;Difficulty sequencing;Requires verbal cues;Requires tactile cues        Exercises      General Comments        Pertinent Vitals/Pain Pain Assessment: No/denies pain Faces Pain Scale: No hurt    Home Living                      Prior Function            PT Goals (current goals can now be found in the care plan section) Acute Rehab PT  Goals PT Goal Formulation: With patient/family Time For Goal Achievement: 10/28/18 Potential to Achieve Goals: Good Progress towards PT goals: Progressing toward goals    Frequency    Min 4X/week      PT Plan Current plan  remains appropriate    Co-evaluation              AM-PAC PT "6 Clicks" Daily Activity  Outcome Measure  Difficulty turning over in bed (including adjusting bedclothes, sheets and blankets)?: None Difficulty moving from lying on back to sitting on the side of the bed? : None Difficulty sitting down on and standing up from a chair with arms (e.g., wheelchair, bedside commode, etc,.)?: None Help needed moving to and from a bed to chair (including a wheelchair)?: A Little Help needed walking in hospital room?: A Little Help needed climbing 3-5 steps with a railing? : A Little 6 Click Score: 21    End of Session   Activity Tolerance: Patient tolerated treatment well Patient left: in bed;with call bell/phone within reach;with family/visitor present Nurse Communication: Mobility status PT Visit Diagnosis: Other symptoms and signs involving the nervous system (B28.413(R29.898)     Time: [id number] PT Time Calculation (min) (ACUTE ONLY): 19 min  Charges:  $Gait Training: 8-22 mins                     Deborah ChalkJennifer Nishita Isaacks, [place] CarolinaPT, DPT  Acute Rehabilitation Services Pager [phone] Office [phone]     Alessandra BevelsJennifer M Christen Bedoya 10/15/2018, 12:46 PM

## 2018-10-15 NOTE — Care Management Note (Addendum)
Case Management Note  Patient Details  Name: Ashley Buck MRN: [id number] Date of Birth: Jul 20, 1946  Subjective/Objective:     Admitted secondary to confusion and difficulty speaking / L basal ganglia infarct. From home with husband. PTA independent with ADL's, DME usage.  Conchita ParisHarold Quiocho (Spouse)     [phone]                  PCP: Maurice SmallElaine Griffin  Action/Plan: Transition to home with home health services.  Per PT :Supervision/Assistance - 24 hour;Home health PT.  Husband states wife will have 24/7 supervision and assist. @ d/c.    Family to provide transportation.  Expected Discharge Date:  10/15/18               Expected Discharge Plan:  Home w Home Health Services  In-House Referral:  NA  Discharge planning Services  CM Consult  Post Acute Care Choice:    Choice offered to:  Patient, Spouse  DME Arranged:  N/A DME Agency:  NA  HH Arranged:  PT, OT, Speech Therapy HH Agency:  [facility]  Status of Service:  Completed, signed off  If discussed at Long Length of Stay Meetings, dates discussed:    Additional Comments:  Epifanio LeschesCole, Anneke Cundy Hudson, RN 10/15/2018, 1:46 PM

## 2018-10-15 NOTE — Discharge Summary (Signed)
Ashley Buck, is a 72 y.o. female  DOB May 07, 1946  MRN [id number].  Admission date:  10/13/2018  Admitting Physician  Haydee Monicaachal A David, MD  Discharge Date:  10/15/2018   Primary MD  Maurice SmallGriffElliot Cousinin, [name], MD  Recommendations for primary care physician for things to follow:  -Please check CBC, BMP during next visit to ensure stable hyponatremia -to follow with  neurology as an outpatient, inpatient referral has been sent  Admission Diagnosis  Disorientation [R41.0] Word finding difficulty [R47.89]   Discharge Diagnosis  Disorientation [R41.0] Word finding difficulty [R47.89]    Principal Problem:   Acute encephalopathy Active Problems:   Essential hypertension   Hyponatremia   Aphasia   Cerebral thrombosis with cerebral infarction   Acute CVA (cerebrovascular accident) [facility](HCC)   Disorientation   Hyperlipidemia      Past Medical History:  Diagnosis Date  . CVA (cerebral vascular accident) (HCC) 10/14/2018  . Depression   . GERD (gastroesophageal reflux disease)   . Stress   . TIA (transient ischemic attack) 09/21/2018    Past Surgical History:  Procedure Laterality Date  . BREAST EXCISIONAL BIOPSY Left 2010   benign       History of present illness and  Hospital Course:     Kindly see H&P for history of present illness and admission details, please review complete Labs, Consult reports and Test reports for all details in brief  HPI  from the history and physical done on the day of admission 10/14/2018  [name] is a 72 y.o. female with history of hypertension and anxiety who was recently admitted and discharged on September 21, 2018 ,3 weeks ago after being admitted for possible TIA at that time work-up was largely unremarkable , was found to be confused last evening while traveling with her husband in the car.  [name] was significant for acute CVA, she was seen by  the service, admitted for further work-up.  Hospital Course     Acute CVA -Patient presents with altered mental status, [name] significant for acute CVA, recent hospitalization at [facility] along with complete TIA work-up, was on aspirin and Plavix for 3 weeks, currently only on aspirin on admission -MRI significant for left basal ganglia infarct, most likely secondary to small vessel disease, CTA head and neck significant for no large vessel disease, severe R P2 stenosis, moderate PCA stenosis, recommendation for dual antiplatelet therapy aspirin and Plavix for 3 weeks, then to continue with Plavix alone. -Globin A1c is 4.7  Hypertension -Permissive hypertension during hospital stay, resume home meds and discharge(stopped hydrochlorothiazide secondary to mild hyponatremia)  Hyperlipidemia -LDL is 113, she is already on atorvastatin 80 mg oral daily, to be continued on discharge  Hyponatremia -Mild, hold hydrochlorothiazide    Discharge Condition:  Stable   Follow UP  [name], [name], MD Follow up in 1 week(s).   Specialty:  Family Medicine Contact information: [address] [place] [address] [phone]  [facility]. Schedule an appointment as soon as possible for a visit in 4 week(s).   Specialty:  Neurology Contact information: [address] [place] [address] [phone]            Discharge Instructions  and  Discharge Medications     Discharge Instructions    Ambulatory referral to Neurology   Complete by:  As directed    F/u with stroke clinic NP ( Jessica Vanschaick or Darrol Angel), if both not available, consider Dr. Delia Heady, Dr. Joycelyn Schmid, or Dr. Naomie Dean) At [facility] in about 4 weeks.   Discharge instructions   Complete by:  As directed    [name], MD in 7 days   Get CBC,  CMP, checked  by Primary MD next visit.    Activity: As tolerated with Full fall precautions use walker/cane & assistance as needed   Disposition Home   Diet: Heart Healthy , with feeding assistance and aspiration precautions.  For Heart failure patients - Check your Weight same time everyday, if you gain over 2 pounds, or you develop in leg swelling, experience more shortness of breath or chest pain, call your Primary MD immediately. Follow Cardiac Low Salt Diet and 1.5 lit/day fluid restriction.   On your next visit with your primary care physician please Get Medicines reviewed and adjusted.   Please request your Prim.MD to go over all Hospital Tests and Procedure/Radiological results at the follow up, please get all Hospital records sent to your Prim MD by signing hospital release before you go home.   If you experience worsening of your admission symptoms, develop shortness of breath, life threatening emergency, suicidal or homicidal thoughts you must seek medical attention immediately by calling 911 or calling your MD immediately  if symptoms less severe.  You Must read complete instructions/literature along with all the possible adverse reactions/side effects for all the Medicines you take and that have been prescribed to you. Take any new Medicines after you have completely understood and accpet all the possible adverse reactions/side effects.   Do not drive, operating heavy machinery, perform activities at heights, swimming or participation in water activities or provide baby sitting services if your were admitted for syncope or siezures until you have seen by Primary MD or a Neurologist and advised to do so again.  Do not drive when taking Pain medications.    Do not take more than prescribed Pain, Sleep and Anxiety Medications  Special Instructions: If you have smoked or chewed Tobacco  in the last 2 yrs please stop smoking, stop any regular Alcohol  and or any Recreational  drug use.  Wear Seat belts while driving.   Please note  You were cared for by a hospitalist during your hospital stay. If you have any questions about your discharge medications or the care you received while you were in the hospital after you are discharged, you can call the unit and asked to speak with the hospitalist on call if the hospitalist that took care of you is not available. Once you are discharged, your primary care physician will handle any further medical issues. Please note that NO REFILLS for any discharge medications will be authorized once you are discharged, as it is imperative that you return to your primary care physician (or establish a relationship with a primary care physician if you do not have one) for your aftercare needs so that they can reassess your  need for medications and monitor your lab values.   Increase activity slowly   Complete by:  As directed      Allergies as of 10/15/2018      Reactions   Potassium-containing Compounds Other (See Comments)   unknown   Sulfur Other (See Comments)   unknown      Medication List    STOP taking these medications   quinapril-hydrochlorothiazide 20-25 MG tablet Commonly known as:  ACCURETIC     TAKE these medications   aspirin 81 MG EC tablet Take 1 tablet (81 mg total) by mouth daily for 20 days. Take for 3 weeks  and stop Start taking on:  10/16/2018 What changed:  additional instructions   atorvastatin 80 MG tablet Commonly known as:  LIPITOR Take 1 tablet (80 mg total) by mouth daily at 6 PM.   clopidogrel 75 MG tablet Commonly known as:  PLAVIX Take 1 tablet (75 mg total) by mouth daily. Start taking on:  10/16/2018   escitalopram 5 MG tablet Commonly known as:  LEXAPRO Take 5 mg by mouth daily.   metoprolol tartrate 25 MG tablet Commonly known as:  LOPRESSOR Take 1 tablet (25 mg total) by mouth 2 (two) times daily.   metroNIDAZOLE 0.75 % cream Commonly known as:  METROCREAM Apply 1  application topically 2 (two) times daily as needed (irritation).   pantoprazole 40 MG tablet Commonly known as:  PROTONIX Take 40 mg by mouth daily.   quinapril 20 MG tablet Commonly known as:  ACCUPRIL Take 1 tablet (20 mg total) by mouth 2 (two) times daily.   venlafaxine XR 37.5 MG 24 hr capsule Commonly known as:  EFFEXOR-XR Take 37.5 mg by mouth daily with breakfast.         Diet and Activity recommendation: See Discharge Instructions above  Consults obtained -  Neurolgoy   Major procedures and Radiology Reports - PLEASE review detailed and final reports for all details, in brief -     Ct Angio Head W Or Wo Contrast  Result Date: 10/14/2018 CLINICAL DATA:  Follow up stroke. EXAM: CT ANGIOGRAPHY HEAD AND NECK TECHNIQUE: Multidetector CT imaging of the head and neck was performed using the standard protocol during bolus administration of intravenous contrast. Multiplanar CT image reconstructions and MIPs were obtained to evaluate the vascular anatomy. Carotid stenosis measurements (when applicable) are obtained utilizing NASCET criteria, using the distal internal carotid diameter as the denominator. CONTRAST:  75mL ISOVUE-370 IOPAMIDOL (ISOVUE-370) INJECTION 76% COMPARISON:  MRI head October 14, 2018 and MRA head September 21, 2018. FINDINGS: CTA NECK FINDINGS: AORTIC ARCH: Normal appearance of the thoracic arch, normal branch pattern. Moderate calcific atherosclerosis aortic arch. The origins of the innominate, left Common carotid artery and subclavian artery are widely patent. RIGHT CAROTID SYSTEM: Common carotid artery is patent. Minimal calcific atherosclerosis of the carotid bifurcation without hemodynamically significant stenosis by NASCET criteria. Normal appearance of the internal carotid artery. LEFT CAROTID SYSTEM: Common carotid artery is patent. Normal appearance of the carotid bifurcation without hemodynamically significant stenosis by NASCET criteria. Normal  appearance of the internal carotid artery. VERTEBRAL ARTERIES:RIGHT vertebral artery is dominant. Patent vertebral arteries mild extrinsic deformity due to degenerative cervical spine. SKELETON: No acute osseous process though bone windows have not been submitted. Subcentimeter calcified LEFT thyroid nodule, no routine indicated follow-up by consensus. OTHER NECK: Soft tissues of the neck are nonacute though, not tailored for evaluation. Severe bilateral C4-5 and RIGHT C5-6 neural foraminal narrowing. Neural foraminal narrowing. UPPER CHEST:  Included lung apices are clear. No superior mediastinal lymphadenopathy. CTA HEAD FINDINGS: ANTERIOR CIRCULATION: Patent cervical internal carotid arteries, petrous, cavernous and supra clinoid internal carotid arteries. Patent anterior communicating artery. Patent anterior and middle cerebral arteries, mild luminal irregularity compatible with atherosclerosis. No large vessel occlusion, significant stenosis, contrast extravasation or aneurysm. POSTERIOR CIRCULATION: Patent vertebral arteries, vertebrobasilar junction and basilar artery, as well as main branch vessels. LEFT vertebral artery predominantly terminates in the posterior inferior cerebellar artery. Patent posterior cerebral arteries, tandem moderate stenosis. Severe stenosis RIGHT distal P2. No large vessel occlusion, significant stenosis, contrast extravasation or aneurysm. VENOUS SINUSES: Major dural venous sinuses are patent though not tailored for evaluation on this angiographic examination. ANATOMIC VARIANTS: None. DELAYED PHASE: No abnormal intracranial enhancement. MIP images reviewed. IMPRESSION: CTA NECK: 1. No hemodynamically significant stenosis ICA's. 2. Patent vertebral arteries. CTA HEAD: 1. No emergent large vessel occlusion. 2. Severe stenosis RIGHT P2 segment, moderate PCA atherosclerosis. Aortic Atherosclerosis (ICD10-I70.0). Electronically Signed   By: Awilda Metro M.D.   On: 10/14/2018 22:12     Dg Chest 2 View  Result Date: 10/13/2018 CLINICAL DATA:  Confusion, shortness of breath. EXAM: CHEST - 2 VIEW COMPARISON:  Chest radiograph September 20, 2018 FINDINGS: Cardiomediastinal silhouette is normal. Mildly calcified aortic arch. No pleural effusions or focal consolidations. Trachea projects midline and there is no pneumothorax. Soft tissue planes and included osseous structures are non-suspicious. Cervical facet arthropathy. IMPRESSION: Stable examination: No acute cardiopulmonary process. Aortic Atherosclerosis (ICD10-I70.0). Electronically Signed   By: Awilda Metro M.D.   On: 10/13/2018 22:24   Dg Chest 2 View  Result Date: 09/20/2018 CLINICAL DATA:  Tingling and numbness left base since last night. EXAM: CHEST - 2 VIEW COMPARISON:  04/04/2005 FINDINGS: Lungs are adequately inflated without focal airspace consolidation or effusion. Cardiomediastinal silhouette, bones and soft tissues are unchanged. IMPRESSION: No active cardiopulmonary disease. Electronically Signed   By: Elberta Fortis M.D.   On: 09/20/2018 23:38   Ct Head Wo Contrast  Result Date: 10/13/2018 CLINICAL DATA:  2 hours a confusion. EXAM: CT HEAD WITHOUT CONTRAST TECHNIQUE: Contiguous axial images were obtained from the base of the skull through the vertex without intravenous contrast. COMPARISON:  August 31, 2018 FINDINGS: Brain: Chronic white matter changes. No acute cortical ischemia or infarct identified. The most focal white matter changes in the left insula on axial image 12, unchanged. Ventricles and sulci are normal. Cerebellum, brainstem, and basal cisterns are normal. No mass effect or midline shift. No acute cortical ischemia or infarct. Vascular: Calcified atherosclerosis in the intracranial carotids. Skull: Normal. Negative for fracture or focal lesion. Sinuses/Orbits: No acute finding. Other: None. IMPRESSION: 1. Chronic white matter changes. No acute intracranial abnormalities. Electronically Signed   By:  Gerome Sam III M.D   On: 10/13/2018 22:11   Ct Head Wo Contrast  Result Date: 09/20/2018 CLINICAL DATA:  Tingling and numbness of the face lips and left hand last evening and again this afternoon. EXAM: CT HEAD WITHOUT CONTRAST TECHNIQUE: Contiguous axial images were obtained from the base of the skull through the vertex without intravenous contrast. COMPARISON:  None. FINDINGS: Brain: Age-indeterminate small vessel ischemic disease of periventricular white matter. No hydrocephalus, intra-axial mass nor extra-axial fluid collections. No hemorrhage, large vascular territory infarct, midline shift or edema. Midline fourth ventricle and basal cisterns. Vascular: No hyperdense vessel sign. Atherosclerosis of the carotid siphons. Skull: Negative Sinuses/Orbits: Nonacute Other: None IMPRESSION: Age-indeterminate small vessel ischemic disease of periventricular white matter. Electronically Signed   By:  Tollie Eth M.D.   On: 09/20/2018 19:38   Ct Angio Neck W Or Wo Contrast  Result Date: 10/14/2018 CLINICAL DATA:  Follow up stroke. EXAM: CT ANGIOGRAPHY HEAD AND NECK TECHNIQUE: Multidetector CT imaging of the head and neck was performed using the standard protocol during bolus administration of intravenous contrast. Multiplanar CT image reconstructions and MIPs were obtained to evaluate the vascular anatomy. Carotid stenosis measurements (when applicable) are obtained utilizing NASCET criteria, using the distal internal carotid diameter as the denominator. CONTRAST:  75mL ISOVUE-370 IOPAMIDOL (ISOVUE-370) INJECTION 76% COMPARISON:  MRI head October 14, 2018 and MRA head September 21, 2018. FINDINGS: CTA NECK FINDINGS: AORTIC ARCH: Normal appearance of the thoracic arch, normal branch pattern. Moderate calcific atherosclerosis aortic arch. The origins of the innominate, left Common carotid artery and subclavian artery are widely patent. RIGHT CAROTID SYSTEM: Common carotid artery is patent. Minimal calcific  atherosclerosis of the carotid bifurcation without hemodynamically significant stenosis by NASCET criteria. Normal appearance of the internal carotid artery. LEFT CAROTID SYSTEM: Common carotid artery is patent. Normal appearance of the carotid bifurcation without hemodynamically significant stenosis by NASCET criteria. Normal appearance of the internal carotid artery. VERTEBRAL ARTERIES:RIGHT vertebral artery is dominant. Patent vertebral arteries mild extrinsic deformity due to degenerative cervical spine. SKELETON: No acute osseous process though bone windows have not been submitted. Subcentimeter calcified LEFT thyroid nodule, no routine indicated follow-up by consensus. OTHER NECK: Soft tissues of the neck are nonacute though, not tailored for evaluation. Severe bilateral C4-5 and RIGHT C5-6 neural foraminal narrowing. Neural foraminal narrowing. UPPER CHEST: Included lung apices are clear. No superior mediastinal lymphadenopathy. CTA HEAD FINDINGS: ANTERIOR CIRCULATION: Patent cervical internal carotid arteries, petrous, cavernous and supra clinoid internal carotid arteries. Patent anterior communicating artery. Patent anterior and middle cerebral arteries, mild luminal irregularity compatible with atherosclerosis. No large vessel occlusion, significant stenosis, contrast extravasation or aneurysm. POSTERIOR CIRCULATION: Patent vertebral arteries, vertebrobasilar junction and basilar artery, as well as main branch vessels. LEFT vertebral artery predominantly terminates in the posterior inferior cerebellar artery. Patent posterior cerebral arteries, tandem moderate stenosis. Severe stenosis RIGHT distal P2. No large vessel occlusion, significant stenosis, contrast extravasation or aneurysm. VENOUS SINUSES: Major dural venous sinuses are patent though not tailored for evaluation on this angiographic examination. ANATOMIC VARIANTS: None. DELAYED PHASE: No abnormal intracranial enhancement. MIP images reviewed.  IMPRESSION: CTA NECK: 1. No hemodynamically significant stenosis ICA's. 2. Patent vertebral arteries. CTA HEAD: 1. No emergent large vessel occlusion. 2. Severe stenosis RIGHT P2 segment, moderate PCA atherosclerosis. Aortic Atherosclerosis (ICD10-I70.0). Electronically Signed   By: Awilda Metro M.D.   On: 10/14/2018 22:12   Mr Brain Wo Contrast  Result Date: 10/14/2018 CLINICAL DATA:  Altered level of consciousness. Confusion and difficulty speaking EXAM: MRI HEAD WITHOUT CONTRAST TECHNIQUE: Multiplanar, multiecho pulse sequences of the brain and surrounding structures were obtained without intravenous contrast. COMPARISON:  CT head 10/13/2018, MRI 09/21/2018 FINDINGS: Brain: Acute infarct left basal ganglia involving the globus pallidus, genu internal capsule and putamen. No other acute infarct Mild to moderate chronic microvascular ischemic changes in the white matter. Brainstem intact. Ventricle size normal. Negative for hemorrhage or mass. No midline shift. Vascular: Normal arterial flow voids Skull and upper cervical spine: Negative Sinuses/Orbits: Negative Other: None IMPRESSION: Acute infarct left basal ganglia, not present on the prior MRI. No hemorrhage Mild to moderate chronic microvascular ischemic changes throughout the cerebral white matter bilaterally. Electronically Signed   By: Marlan Palau M.D.   On: 10/14/2018 13:48   Mr  Brain Wo Contrast  Result Date: 09/21/2018 CLINICAL DATA:  72 y/o F; tingling and numbness of the left face, lips, and left hand last night and again this afternoon. TIA, initial exam. EXAM: MRI HEAD WITHOUT CONTRAST MRA HEAD WITHOUT CONTRAST TECHNIQUE: Multiplanar, multiecho pulse sequences of the brain and surrounding structures were obtained without intravenous contrast. Angiographic images of the head were obtained using MRA technique without contrast. COMPARISON:  09/20/2018 CT head. FINDINGS: MRI HEAD FINDINGS Brain: No acute infarction, hemorrhage,  hydrocephalus, extra-axial collection or mass lesion. Several nonspecific T2 FLAIR hyperintensities in subcortical and periventricular white matter are compatible with moderate chronic microvascular ischemic changes. Moderate volume loss of the brain. Vascular: As below. Skull and upper cervical spine: Normal marrow signal. Sinuses/Orbits: Negative. Other: None. MRA HEAD FINDINGS Internal carotid arteries:  Patent. Anterior cerebral arteries:  Patent. Middle cerebral arteries: Patent. Anterior communicating artery: Patent. Posterior communicating arteries: Probable small left posterior communicating artery. No right posterior communicating artery identified, likely hypoplastic or absent. Posterior cerebral arteries:  Patent. Basilar artery:  Patent. Vertebral arteries:  Patent.  Right dominant. No evidence of high-grade stenosis, large vessel occlusion, or aneurysm. IMPRESSION: 1. No acute intracranial abnormality identified. 2. Moderate chronic microvascular ischemic changes and volume loss of the brain. 3. Patent anterior and posterior intracranial circulation. No large vessel occlusion, aneurysm, or significant stenosis. Electronically Signed   By: Mitzi Hansen M.D.   On: 09/21/2018 07:00   Mr Shirlee Latch ZO Contrast  Result Date: 09/21/2018 CLINICAL DATA:  72 y/o F; tingling and numbness of the left face, lips, and left hand last night and again this afternoon. TIA, initial exam. EXAM: MRI HEAD WITHOUT CONTRAST MRA HEAD WITHOUT CONTRAST TECHNIQUE: Multiplanar, multiecho pulse sequences of the brain and surrounding structures were obtained without intravenous contrast. Angiographic images of the head were obtained using MRA technique without contrast. COMPARISON:  09/20/2018 CT head. FINDINGS: MRI HEAD FINDINGS Brain: No acute infarction, hemorrhage, hydrocephalus, extra-axial collection or mass lesion. Several nonspecific T2 FLAIR hyperintensities in subcortical and periventricular white matter are  compatible with moderate chronic microvascular ischemic changes. Moderate volume loss of the brain. Vascular: As below. Skull and upper cervical spine: Normal marrow signal. Sinuses/Orbits: Negative. Other: None. MRA HEAD FINDINGS Internal carotid arteries:  Patent. Anterior cerebral arteries:  Patent. Middle cerebral arteries: Patent. Anterior communicating artery: Patent. Posterior communicating arteries: Probable small left posterior communicating artery. No right posterior communicating artery identified, likely hypoplastic or absent. Posterior cerebral arteries:  Patent. Basilar artery:  Patent. Vertebral arteries:  Patent.  Right dominant. No evidence of high-grade stenosis, large vessel occlusion, or aneurysm. IMPRESSION: 1. No acute intracranial abnormality identified. 2. Moderate chronic microvascular ischemic changes and volume loss of the brain. 3. Patent anterior and posterior intracranial circulation. No large vessel occlusion, aneurysm, or significant stenosis. Electronically Signed   By: Mitzi Hansen M.D.   On: 09/21/2018 07:00   Vas US Carotid  Result Date: 09/21/2018 Carotid Arterial Duplex Study Indications:       TIA, Numbness and Weakness. Comparison Study:  No prior study available Performing Technologist: Hongying Cole  Examination Guidelines: A complete evaluation includes B-mode imaging, spectral Doppler, color Doppler, and power Doppler as needed of all accessible portions of each vessel. Bilateral testing is considered an integral part of a complete examination. Limited examinations for reoccurring indications may be performed as noted.  Right Carotid Findings: +----------+--------+--------+--------+------------------------------+--------+           PSV cm/sEDV cm/sStenosisDescribe  Comments +----------+--------+--------+--------+------------------------------+--------+ CCA Prox  116     21              diffuse, smooth and hypoechoic          +----------+--------+--------+--------+------------------------------+--------+ CCA Distal95      22                                                     +----------+--------+--------+--------+------------------------------+--------+ ICA Prox  123     42      40-59%  diffuse and hyperechoic                +----------+--------+--------+--------+------------------------------+--------+ ICA Distal138     45                                                     +----------+--------+--------+--------+------------------------------+--------+ ECA       110     23                                                     +----------+--------+--------+--------+------------------------------+--------+ +----------+--------+-------+--------+-------------------+           PSV cm/sEDV cmsDescribeArm Pressure (mmHG) +----------+--------+-------+--------+-------------------+ [id number]                                        +----------+--------+-------+--------+-------------------+ +---------+--------+--+--------+--+---------------+ VertebralPSV cm/s78EDV cm/s21Bi- directional +---------+--------+--+--------+--+---------------+  Left Carotid Findings: +----------+--------+--------+--------+---------------------+--------+           PSV cm/sEDV cm/sStenosisDescribe             Comments +----------+--------+--------+--------+---------------------+--------+ CCA Prox  108     20                                            +----------+--------+--------+--------+---------------------+--------+ CCA Distal95      28                                            +----------+--------+--------+--------+---------------------+--------+ ICA Prox  78      24      1-39%   focal and hyperechoic         +----------+--------+--------+--------+---------------------+--------+ ICA Distal94      31                                             +----------+--------+--------+--------+---------------------+--------+ ECA       122     13                                            +----------+--------+--------+--------+---------------------+--------+ +----------+--------+--------+--------+-------------------+  SubclavianPSV cm/sEDV cm/sDescribeArm Pressure (mmHG) +----------+--------+--------+--------+-------------------+           135                                         +----------+--------+--------+--------+-------------------+ +---------+--------+--+--------+--+---------+ VertebralPSV cm/s38EDV cm/s12Antegrade +---------+--------+--+--------+--+---------+  Summary: Right Carotid: Velocities in the right ICA are consistent with a 40-59%                stenosis. Left Carotid: Velocities in the left ICA are consistent with a 1-39% stenosis. Vertebrals: Left vertebral artery demonstrates antegrade flow. Right vertebral             artery demonstrates bidirectional flow. *See table(s) above for measurements and observations.  Electronically signed by Waverly Ferrari MD on 09/21/2018 at 5:23:46 PM.    Final     Micro Results     No results found for this or any previous visit (from the past 240 hour(s)).     Today   Subjective:   Ashley Buck today has no headache,no chest or abdominal pain, husband reports wife feels better today, she is more awake and coherent, not back to baseline yet. Objective:   Blood pressure 135/71, pulse 70, temperature 98.5 F (36.9 C), temperature source Oral, resp. rate 14, height 5\' 6"  (1.676 m), weight 65.7 kg, SpO2 97 %.   Intake/Output Summary (Last 24 hours) at 10/15/2018 1222 Last data filed at 10/15/2018 0946 Gross per 24 hour  Intake 1032.85 ml  Output -  Net 1032.85 ml    Exam Awake Alert, Oriented x 2, still mild impaired cognition, but overall significantly improved since yesterday. Symmetrical Chest wall movement, Good air movement bilaterally,  CTAB RRR,No Gallops,Rubs or new Murmurs, No Parasternal Heave +ve B.Sounds, Abd Soft, Non tender, No rebound -guarding or rigidity. No Cyanosis, Clubbing or edema, No new Rash or bruise  Data Review   CBC w Diff:  Lab Results  Component Value Date   WBC 4.9 10/15/2018   HGB 11.1 (L) 10/15/2018   HCT 34.2 (L) 10/15/2018   PLT 148 (L) 10/15/2018   LYMPHOPCT 26 10/13/2018   MONOPCT 9 10/13/2018   EOSPCT 1 10/13/2018   BASOPCT 1 10/13/2018    CMP:  Lab Results  Component Value Date   NA 132 (L) 10/15/2018   K 3.3 (L) 10/15/2018   CL 103 10/15/2018   CO2 22 10/15/2018   BUN 14 10/15/2018   CREATININE 0.83 10/15/2018   PROT 6.2 (L) 10/14/2018   ALBUMIN 3.7 10/14/2018   BILITOT 0.9 10/14/2018   ALKPHOS 34 (L) 10/14/2018   AST 18 10/14/2018   ALT 16 10/14/2018  .   Total Time in preparing paper work, data evaluation and todays exam - 35 minutes  Huey Bienenstock M.D on 10/15/2018 at 12:22 PM  [facility]   Office  [phone]

## 2018-10-15 NOTE — Progress Notes (Addendum)
STROKE TEAM PROGRESS NOTE   INTERVAL HISTORY Her husband is at the bedside.  Patient in bed awake, alert, NAD. Husband's questions answered.  Vitals:   10/14/18 2049 10/14/18 2333 10/15/18 0442 10/15/18 0810  BP: (!) 117/53 126/66 132/66 131/73  Pulse: 70 64  60  Resp: 17 16 14    Temp: 98 F (36.7 C) 98.6 F (37 C) 98.5 F (36.9 C) 98.2 F (36.8 C)  TempSrc: Oral Oral Oral Oral  SpO2: 96% 98% 98% 96%  Weight:      Height:        CBC:  Recent Labs  Lab 10/13/18 2044 10/14/18 0551 10/15/18 0304  WBC 4.9 3.6* 4.9  NEUTROABS 3.0  --   --   HGB 12.6 12.1 11.1*  HCT 37.4 35.5* 34.2*  MCV 83.5 82.4 84.7  PLT 167 145* 148*    Basic Metabolic Panel:  Recent Labs  Lab 10/14/18 0551 10/15/18 0304  NA 132* 132*  K 3.0* 3.3*  CL 100 103  CO2 23 22  GLUCOSE 86 79  BUN 10 14  CREATININE 0.88 0.83  CALCIUM 8.6* 7.9*   Lipid Panel:     Component Value Date/Time   CHOL 200 09/21/2018 0555   TRIG 71 09/21/2018 0555   HDL 73 09/21/2018 0555   CHOLHDL 2.7 09/21/2018 0555   VLDL 14 09/21/2018 0555   LDLCALC 113 (H) 09/21/2018 0555   HgbA1c:  Lab Results  Component Value Date   HGBA1C 4.7 (L) 09/21/2018    IMAGING Ct Angio Head W Or Wo Contrast  Result Date: 10/14/2018  IMPRESSION: CTA NECK: 1. No hemodynamically significant stenosis ICA's. 2. Patent vertebral arteries. CTA HEAD: 1. No emergent large vessel occlusion. 2. Severe stenosis RIGHT P2 segment, moderate PCA atherosclerosis. Aortic Atherosclerosis (ICD10-I70.0).  Dg Chest 2 View  Result Date: 10/13/2018 CLINICAL DATA:  Confusion, shortness of breath. EXAM: CHEST - 2 VIEW COMPARISON:  Chest radiograph September 20, 2018 FINDINGS: Cardiomediastinal silhouette is normal. Mildly calcified aortic arch. No pleural effusions or focal consolidations. Trachea projects midline and there is no pneumothorax. Soft tissue planes and included osseous structures are non-suspicious. Cervical facet arthropathy. IMPRESSION:  Stable examination: No acute cardiopulmonary process. Aortic Atherosclerosis (ICD10-I70.0). Electronically Signed   By: Awilda Metro M.D.   On: 10/13/2018 22:24   Ct Head Wo Contrast  Result Date: 10/13/2018 . IMPRESSION: 1. Chronic white matter changes. No acute intracranial abnormalities.  Ct Angio Neck W Or Wo Contrast   Result Date: 10/14/2018  IMPRESSION: CTA NECK: 1. No hemodynamically significant stenosis ICA's. 2. Patent vertebral arteries. CTA HEAD: 1. No emergent large vessel occlusion. 2. Severe stenosis RIGHT P2 segment, moderate PCA atherosclerosis. Aortic Atherosclerosis (ICD10-I70.0)  Mr Brain Wo Contrast  Result Date: 10/14/2018  IMPRESSION: Acute infarct left basal ganglia, not present on the prior MRI. No hemorrhage Mild to moderate chronic microvascular ischemic changes throughout the cerebral white matter bilaterally.   PHYSICAL EXAM HEENT-  Normocephalic, no lesions, without obvious abnormality.  Normal external eye and conjunctiva.  Cardiovascular- S1-S2 audible, pulses palpable throughout  Lungs-no rhonchi or wheezing noted, no excessive working breathing.  Saturations within normal limits on RA Abdomen- All 4 quadrants palpated and nontender Extremities- Warm, dry and intact Musculoskeletal-no joint tenderness, deformity or swelling Skin-warm and dry, no hyperpigmentation, vitiligo, or suspicious lesions  Neurological Examination Mental Status: Alert, oriented, name/age. thought content appropriate.  Speech fluent without evidence of aphasia.  No dysarthria noted. Able to follow  commands without difficulty. Cranial Nerves: II:  Visual fields grossly normal,  III,IV, VI: ptosis not present, extra-ocular motions intact bilaterally, pupils equal, round, reactive to light and accommodation V,VII: smile symmetric, facial light touch sensation normal bilaterally VIII: hearing normal bilaterally IX,X: uvula rises symmetrically XI: bilateral shoulder  shrug XII: midline tongue extension Motor: Right :  Upper extremity   5/5  Left:     Upper extremity   5/5             Lower extremity   5/5              Lower extremity   5/5 Tone and bulk:normal tone throughout; no atrophy noted Sensory:  light touch intact throughout, bilaterally Plantars: Right: downgoing                                Left: downgoing Cerebellar: normal finger-to-nose, normal rapid alternating movements Gait: deferred  ASSESSMENT/PLAN Ms. ANAIH BRANDER is a80 y.o. female With PMH significant for HTN, anxiety, TIA who presented to [facility] with c/o confusion and difficulty speaking. CTH: no hemorrhage. No TPA d/t outside of TPA window.MRI: left basal ganglia infarct. Stroke work-up was completed about 3 weeks ago.  Stroke:  left basal ganglia infarct embolic secondary to small vessel disease source  CT head no hemorrhage; chronic white matter changes  CTA head NO LVO; severe R P2 stenosis, moderate PCA stenosis  CTA neck: patent vertebral arteries; no significant stenosis ICA's  MRI   Acute left basal ganglia infarct; no hemorrhage  Carotid Doppler  09/21/18: RICA 40-59% stenosis, LICA 1-39% stenosis  2D Echo  09/21/18: Left ventricle: The cavity size was normal. Wall thickness was   increased in a pattern of mild LVH. Systolic function was   vigorous. The estimated ejection fraction was in the range of 65%   to 70%. - Mitral valve: Calcified annulus. Mildly thickened leaflets . - Pericardium, extracardiac: Small pericardial effusion surrounds    heart.  LDL 113  HgbA1c 4.7  Lovenox for VTE prophylaxis  aspirin 81 mg daily prior to admission, now on aspirin 81 mg daily and clopidogrel 75 mg daily. Continue ASA and plavix for 3 weeks and then stop ASA and continue on Plavix alone.   Therapy recommendations:  SLP: home health SLP; 24 hr supervision  Disposition:  Home  F/u with outpatient neurology in 4 weeks. ( referral  in)  Hypertension  Stable . Permissive hypertension (OK if < 220/120) but gradually normalize in 5-7 days . Long-term BP goal normotensive . Home med: metoprolol; home med has not been re-started in hospital. . F/u with PCP  Hyperlipidemia  Home meds:  atorvastatin 80 mg, resumed in hospital  LDL113 , goal < 70  Continue statin at discharge  Diabetes type II  HgbA1c 4.7, goal < 7.0  Controlled  Other Stroke Risk Factors  Advanced age  Hx TIA 09/21/18    Other Active Problems  Hyponatremia 132  Hypokalemia 3.3  Hospital day # 1  Neurology to sign off at this time. Please page with any further concerns.  Valentina Lucks, MSN, NP-C [facility] [phone]  ATTENDING NOTE: I reviewed above note and agree with the assessment and plan. Pt was seen and examined.   Husband and son are at the bedside. Pt more interactive today and speech less slow and close to baseline. AAO x 3, able to back spell WORLD, no apraxia, name and repeating normalized. No dysarthria. No focal  deficit.   CTA head and neck showed no ICA stenosis bilaterally, but severe right P2 stenosis. So her right ICA 40-59% stenosis on CUS 3 weeks ago was likely artifact. Will recommend continue ASA 81 and plavix 75 DAPT for 3 weeks and then plavix alone. Continue lipitor for HLD and stroke prevention. PT/OT recommend home health PT and speech.   Neurology will sign off. Please call with questions. [facility]. Thanks for the consult.   Marvel PlanJindong Shian Goodnow, MD PhD Stroke Neurology 10/15/2018 11:38 AM     To contact Stroke Continuity provider, please refer to [email or web address]. After hours, contact General Neurology

## 2018-10-15 NOTE — Discharge Instructions (Signed)
Follow with Primary MD Maurice SmallGriffin, [name], MD in 7 days   Get CBC, CMP, checked  by Primary MD next visit.    Activity: As tolerated with Full fall precautions use walker/cane & assistance as needed   Disposition Home   Diet: Heart Healthy , with feeding assistance and aspiration precautions.  For Heart failure patients - Check your Weight same time everyday, if you gain over 2 pounds, or you develop in leg swelling, experience more shortness of breath or chest pain, call your Primary MD immediately. Follow Cardiac Low Salt Diet and 1.5 lit/day fluid restriction.   On your next visit with your primary care physician please Get Medicines reviewed and adjusted.   Please request your Prim.MD to go over all Hospital Tests and Procedure/Radiological results at the follow up, please get all Hospital records sent to your Prim MD by signing hospital release before you go home.   If you experience worsening of your admission symptoms, develop shortness of breath, life threatening emergency, suicidal or homicidal thoughts you must seek medical attention immediately by calling 911 or calling your MD immediately  if symptoms less severe.  You Must read complete instructions/literature along with all the possible adverse reactions/side effects for all the Medicines you take and that have been prescribed to you. Take any new Medicines after you have completely understood and accpet all the possible adverse reactions/side effects.   Do not drive, operating heavy machinery, perform activities at heights, swimming or participation in water activities or provide baby sitting services if your were admitted for syncope or siezures until you have seen by Primary MD or a Neurologist and advised to do so again.  Do not drive when taking Pain medications.    Do not take more than prescribed Pain, Sleep and Anxiety Medications  Special Instructions: If you have smoked or chewed Tobacco  in the last 2 yrs please  stop smoking, stop any regular Alcohol  and or any Recreational drug use.  Wear Seat belts while driving.   Please note  You were cared for by a hospitalist during your hospital stay. If you have any questions about your discharge medications or the care you received while you were in the hospital after you are discharged, you can call the unit and asked to speak with the hospitalist on call if the hospitalist that took care of you is not available. Once you are discharged, your primary care physician will handle any further medical issues. Please note that NO REFILLS for any discharge medications will be authorized once you are discharged, as it is imperative that you return to your primary care physician (or establish a relationship with a primary care physician if you do not have one) for your aftercare needs so that they can reassess your need for medications and monitor your lab values.

## 2018-10-15 NOTE — Progress Notes (Signed)
Patient was discharged home with home health by MD order; discharged instructions  review and give to patient and her husband with care notes and prescriptions; IV DIC; skin intact; patient will be escorted to the car by nurse tech via wheelchair.

## 2018-11-19 NOTE — Progress Notes (Signed)
[facility] [address] [place] [id number] [phone]       OFFICE FOLLOW UP NOTE  Ms. Ashley Buck Gaetano Date of Birth:  05/19/1946 Medical Record Number:  [id number]   Reason for Referral:  hospital stroke follow up  CHIEF COMPLAINT:  Chief Complaint  Patient presents with  . Hospitalization Follow-up    Stroke follow up. Husband present. Rm 9. No concerns at this time. Patient stated that she feels fine.     [name] Buck is being seen today for initial visit in the office for left basal ganglia infarct secondary to small vessel disease on 10/14/2018. History obtained from patient, husband and chart review. Reviewed all radiology images and labs personally.  Ms. Ashley Buck Ashley Buck is a72 y.o.femaleWith PMH significant for HTN, anxiety, and TIA who [facility] with [facility]o confusion. CT head reviewed and was negative for acute infarct or hemorrhage. No TPA d/t outside of TPA window.CTA head was negative for LVO but did show severe right P2 stenosis and moderate PCA stenosis.  CTA neck showed patent vertebral arteries without significant stenosis in the ICAs.  MRI brain reviewed and showed acute left basal ganglia infarct.  2D echo was performed on 09/21/2018 which showed EF 65 to 70%.  LDL 113 and A1c 4.7.  Infarct etiology likely related to small vessel disease. [facility] with complaints of tingling in her face and left hand.  As all imaging normal, patient was diagnosed with TIA and discharged home.  Patient on aspirin 81 mg PTA and recommended DAPT for 3 weeks and Plavix alone.  Atorvastatin 80 mg initiated after [facility] visit and therefore recommended continuation for HLD management.  A1c 4.7 and recommended continue follow-up with PCP for DM management.  Patient discharged home in stable condition with recommendation of home health PT/OT/ST.  Patient is being seen today for hospital follow-up and  is accompanied by her husband.  She has completed 3 weeks DAPT and continues on Plavix alone without side effects of bleeding or bruising.  Continues on atorvastatin 80 mg without side effects of myalgias.  Blood pressure today satisfactory at 123/73.  Husband states they do check this at home with SBP ranging 1 60-1 80/70s.  Due to the great difference of today'Buck blood pressure reading in the reading they obtained at home, it was recommended for him to bring his blood pressure cuff at next provider appointment in order to ensure accuracy.  She states overall she has been doing well with great improvement of her cognitive abilities.  She has recently completed home health speech therapy but per husband, therapist recommended additional therapy sessions and he is still waiting at this time to see if they were approved for additional sessions.  He does plan on calling them today or Monday for an update.  She has continued doing home exercises on her own and with husband that were recommended during therapy sessions.  No further concerns at this time.  Denies new or worsening stroke/TIA symptoms.    ROS:   14 system review of systems performed and negative with exception of ringing in ears, constipation, memory loss, confusion, headache, numbness, depression, anxiety and decreased energy  PMH:  Past Medical History:  Diagnosis Date  . CVA (cerebral vascular accident) (HCC) 10/14/2018  . Depression   . GERD (gastroesophageal reflux disease)   . Stress   . TIA (transient ischemic attack) 09/21/2018    PSH:  Past Surgical History:  Procedure Laterality Date  . BREAST EXCISIONAL BIOPSY Left 2010   benign    Social History:  Social History   Socioeconomic History  . Marital status: Married    Spouse name: Not on file  . Number of children: Not on file  . Years of education: Not on file  . Highest education level: Not on file  Occupational History  . Not on file  Social Needs  . Financial  resource strain: Not on file  . Food insecurity:    Worry: Not on file    Inability: Not on file  . Transportation needs:    Medical: Not on file    Non-medical: Not on file  Tobacco Use  . Smoking status: Former Games developer  . Smokeless tobacco: Never Used  Substance and Sexual Activity  . Alcohol use: Not on file  . Drug use: Not on file  . Sexual activity: Not on file  Lifestyle  . Physical activity:    Days per week: Not on file    Minutes per session: Not on file  . Stress: Not on file  Relationships  . Social connections:    Talks on phone: Not on file    Gets together: Not on file    Attends religious service: Not on file    Active member of club or organization: Not on file    Attends meetings of clubs or organizations: Not on file    Relationship status: Not on file  . Intimate partner violence:    Fear of current or ex partner: Not on file    Emotionally abused: Not on file    Physically abused: Not on file    Forced sexual activity: Not on file  Other Topics Concern  . Not on file  Social History Narrative  . Not on file    Family History:  Family History  Family history unknown: Yes    Medications:   Current Outpatient Medications on File Prior to Visit  Medication Sig Dispense Refill  . atorvastatin (LIPITOR) 80 MG tablet Take 1 tablet (80 mg total) by mouth daily at 6 PM. 30 tablet 3  . clopidogrel (PLAVIX) 75 MG tablet Take 1 tablet (75 mg total) by mouth daily. 30 tablet 0  . escitalopram (LEXAPRO) 5 MG tablet Take 5 mg by mouth daily.  12  . metoprolol tartrate (LOPRESSOR) 25 MG tablet Take 1 tablet (25 mg total) by mouth 2 (two) times daily. 60 tablet 1  . metroNIDAZOLE (METROCREAM) 0.75 % cream Apply 1 application topically 2 (two) times daily as needed (irritation).   2  . pantoprazole (PROTONIX) 40 MG tablet Take 40 mg by mouth daily.  12  . quinapril (ACCUPRIL) 20 MG tablet Take 1 tablet (20 mg total) by mouth 2 (two) times daily. 60 tablet 0  .  venlafaxine XR (EFFEXOR-XR) 37.5 MG 24 hr capsule Take 37.5 mg by mouth daily with breakfast.   3   No current facility-administered medications on file prior to visit.     Allergies:   Allergies  Allergen Reactions  . Potassium-Containing Compounds Other (See Comments)    unknown  . Sulfur Other (See Comments)    unknown     Physical Exam  Vitals:   11/20/18 0804  BP: 123/73  Pulse: (!) 56  Weight: 156 lb 6.4 oz (70.9 kg)  Height: 5\' 6"  (1.676 m)   Body mass index is 25.24 kg/m. No exam data present  General: well developed,  well nourished, very pleasant elderly Caucasian female, seated, in no evident distress Head: head normocephalic and atraumatic.   Neck: supple with no carotid or supraclavicular bruits Cardiovascular: regular rate and rhythm, no murmurs Musculoskeletal: no deformity Skin:  no rash/petichiae Vascular:  Normal pulses all extremities  Neurologic Exam Mental Status: Awake and fully alert. Oriented to place and time. Recent and remote memory intact. Attention span, concentration and fund of knowledge appropriate. Mood and affect appropriate.  No evidence of comprehension deficits during exam. Cranial Nerves: Fundoscopic exam reveals sharp disc margins. Pupils equal, briskly reactive to light. Extraocular movements full without nystagmus. Visual fields full to confrontation. Hearing intact. Facial sensation intact. Face, tongue, palate moves normally and symmetrically.  Motor: Normal bulk and tone. Normal strength in all tested extremity muscles. Sensory.: intact to touch , pinprick , position and vibratory sensation.  Coordination: Rapid alternating movements normal in all extremities. Finger-to-nose and heel-to-shin performed accurately bilaterally. Gait and Station: Arises from chair without difficulty. Stance is normal. Gait demonstrates normal stride length and balance. Able to heel, toe and tandem walk without difficulty.  Reflexes: 1+ and symmetric.  Toes downgoing.    NIHSS  0 Modified Rankin  1    Diagnostic Data (Labs, Imaging, Testing)  Ct Angio Head W Or Wo Contrast  Result Date: 10/14/2018  IMPRESSION: CTA NECK: 1. No hemodynamically significant stenosis ICA'Buck. 2. Patent vertebral arteries. CTA HEAD: 1. No emergent large vessel occlusion. 2. Severe stenosis RIGHT P2 segment, moderate PCA atherosclerosis. Aortic Atherosclerosis (ICD10-I70.0).  Dg Chest 2 View  Result Date: 10/13/2018 CLINICAL DATA:  Confusion, shortness of breath. EXAM: CHEST - 2 VIEW COMPARISON:  Chest radiograph September 20, 2018 FINDINGS: Cardiomediastinal silhouette is normal. Mildly calcified aortic arch. No pleural effusions or focal consolidations. Trachea projects midline and there is no pneumothorax. Soft tissue planes and included osseous structures are non-suspicious. Cervical facet arthropathy. IMPRESSION: Stable examination: No acute cardiopulmonary process. Aortic Atherosclerosis (ICD10-I70.0). Electronically Signed   By: Awilda Metro M.D.   On: 10/13/2018 22:24   Ct Head Wo Contrast  Result Date: 10/13/2018 . IMPRESSION: 1. Chronic white matter changes. No acute intracranial abnormalities.  Ct Angio Neck W Or Wo Contrast   Result Date: 10/14/2018  IMPRESSION: CTA NECK: 1. No hemodynamically significant stenosis ICA'Buck. 2. Patent vertebral arteries. CTA HEAD: 1. No emergent large vessel occlusion. 2. Severe stenosis RIGHT P2 segment, moderate PCA atherosclerosis. Aortic Atherosclerosis (ICD10-I70.0)  Mr Brain Wo Contrast  Result Date: 10/14/2018  IMPRESSION: Acute infarct left basal ganglia, not present on the prior MRI. No hemorrhage Mild to moderate chronic microvascular ischemic changes throughout the cerebral white matter bilaterally.     ASSESSMENT: Ashley Buck is a 72 y.o. year old female here with left basal ganglia infarct on 10/14/2018 secondary to small vessel disease. Vascular risk factors include HTN, HLD and DM.   Patient is being seen today for hospital follow-up and overall has been doing well with intermittent cognitive difficulties with more complicated tasks but is able to perform all ADLs independently and decision-making without difficulty.    PLAN:  1. Left basal ganglia infarct: Continue clopidogrel 75 mg daily  and atorvastatin 80 mg for secondary stroke prevention. Maintain strict control of hypertension with blood pressure goal below 130/90, diabetes with hemoglobin A1c goal below 6.5% and cholesterol with LDL cholesterol (bad cholesterol) goal below 70 mg/dL.  I also advised the patient to eat a healthy diet with plenty of whole grains, cereals, fruits and vegetables, exercise regularly with  at least 30 minutes of continuous activity daily and maintain ideal body weight.  Agreed with husband and calling [facility] home [facility] for continued therapy sessions.  Advised him that if they have not received additional orders, to call office and orders can be placed 2. HTN: Advised to continue current treatment regimen.  Today'Buck BP 123/73.  Advised to continue to monitor at home along with continued follow-up with PCP for management 3. HLD: Advised to continue current treatment regimen along with continued follow-up with PCP for future prescribing and monitoring of lipid panel.  We will check lipid panel today as this has not been done since hospital discharge and they do not have follow-up appointment scheduled with PCP until 01/2019. 4. Husband states that they have recently went to fill paperwork for POA and HCP but was told that due to patient'Buck recent stroke, she will need provider note stating that she is able to make medical decisions.  Note provided as patient is able to appropriately make medical decisions at this time    Follow up in 3 months or call earlier if needed   Greater than 50% of time during this 25 minute visit was spent on counseling, explanation of diagnosis of left basal ganglia  infarct, reviewing risk factor management of HTN, HLD and DM, planning of further management along with potential future management, and discussion with patient and family answering all questions.    George HughJessica VanSchaick, AGNP-BC  [facility] [address] [place], [place] [id number]  Phone [phone] Fax [phone] Note: This document was prepared with digital dictation and possible smart phrase technology. Any transcriptional errors that result from this process are unintentional.

## 2018-11-20 ENCOUNTER — Encounter: Payer: Self-pay | Admitting: Adult Health

## 2018-11-20 ENCOUNTER — Ambulatory Visit: Payer: Medicare Other | Admitting: Adult Health

## 2018-11-20 VITALS — BP 123/73 | HR 56 | Ht 66.0 in | Wt 156.4 lb

## 2018-11-20 DIAGNOSIS — E785 Hyperlipidemia, unspecified: Secondary | ICD-10-CM

## 2018-11-20 DIAGNOSIS — I639 Cerebral infarction, unspecified: Secondary | ICD-10-CM | POA: Diagnosis not present

## 2018-11-20 DIAGNOSIS — G459 Transient cerebral ischemic attack, unspecified: Secondary | ICD-10-CM

## 2018-11-20 DIAGNOSIS — I10 Essential (primary) hypertension: Secondary | ICD-10-CM

## 2018-11-20 NOTE — Patient Instructions (Addendum)
Continue clopidogrel 75 mg daily  and Lipitor 80 mg for secondary stroke prevention  Continue to follow up with PCP regarding cholesterol and blood pressure management   Continue speech therapies - if you do need additional orders, please call office  Continue to stay active and maintain a healthy diet  Continue to monitor blood pressure at home  Maintain strict control of hypertension with blood pressure goal below 130/90, diabetes with hemoglobin A1c goal below 6.5% and cholesterol with LDL cholesterol (bad cholesterol) goal below 70 mg/dL. I also advised the patient to eat a healthy diet with plenty of whole grains, cereals, fruits and vegetables, exercise regularly and maintain ideal body weight.  Followup in the future with me in 3 months or call earlier if needed       Thank you for coming to see [facility] at [facility]. I hope we have been able to provide you high quality care today.  You may receive a patient satisfaction survey over the next few weeks. We would appreciate your feedback and comments so that we may continue to improve ourselves and the health of our patients.

## 2018-11-21 LAB — LIPID PANEL
CHOL/HDL RATIO: 1.9 ratio (ref 0.0–4.4)
CHOLESTEROL TOTAL: 104 mg/dL (ref 100–199)
HDL: 54 mg/dL (ref >39)
LDL Calculated: 40 mg/dL (ref 0–99)
TRIGLYCERIDES: 50 mg/dL (ref 0–149)
VLDL Cholesterol Cal: 10 mg/dL (ref 5–40)

## 2018-11-29 NOTE — Progress Notes (Signed)
I agree with the above plan 

## 2019-02-23 ENCOUNTER — Telehealth: Payer: Self-pay | Admitting: Adult Health

## 2019-02-23 NOTE — Telephone Encounter (Signed)
error 

## 2019-02-24 ENCOUNTER — Ambulatory Visit (INDEPENDENT_AMBULATORY_CARE_PROVIDER_SITE_OTHER): Payer: Medicare Other | Admitting: Adult Health

## 2019-02-24 ENCOUNTER — Encounter: Payer: Self-pay | Admitting: Adult Health

## 2019-02-24 ENCOUNTER — Other Ambulatory Visit: Payer: Self-pay

## 2019-02-24 DIAGNOSIS — I69319 Unspecified symptoms and signs involving cognitive functions following cerebral infarction: Secondary | ICD-10-CM | POA: Diagnosis not present

## 2019-02-24 DIAGNOSIS — E785 Hyperlipidemia, unspecified: Secondary | ICD-10-CM | POA: Diagnosis not present

## 2019-02-24 DIAGNOSIS — I639 Cerebral infarction, unspecified: Secondary | ICD-10-CM

## 2019-02-24 DIAGNOSIS — I10 Essential (primary) hypertension: Secondary | ICD-10-CM

## 2019-02-24 NOTE — Progress Notes (Signed)
[facility] [address] [place]. [place] [address] [phone]       VIRTUAL VISIT FOLLOW UP NOTE  Ms. Kimora Bolduc Delfin Date of Birth:  05/18/1946 Medical Record Number:  [id number]   Reason for Referral:  hospital stroke follow up   Virtual Visit via Video Note  I connected with BERLIE MASLEY on 02/24/19 at  3:15 PM EDT by a video enabled telemedicine application located in my own home and verified that I am speaking with the correct person using two identifiers who is located in her own home and is accompanied by her husband.   I discussed the limitations of evaluation and management by telemedicine and the availability of in person appointments. The patient expressed understanding and agreed to proceed.   CHIEF COMPLAINT:  Chief Complaint  Patient presents with  . Follow-up    Post stroke cognitive deficits    HPI:  Ms. Lanzi is a 73 year old female who was initially evaluated in our office on 11/20/2018 regarding left basal ganglia infarct in 09/2018.  She initially had scheduled follow-up visit on today's date but due to COVID19 pandemic, and office visits limited therefore transitioned to telemedicine visit via WebEx.  Since prior visit, patient has been stable without any new or worsening stroke/TIA symptoms.  She states her overall cognitive deficit has improved since prior visit but continues to have mild difficulties with simple activities and short-term memory loss.  She has recently completed home speech therapy and continues to work daily with her husband and memory exercises and regaining her independence.  She currently needs assistance with IADLs but is able to maintain ADLs independently.  Husband does endorse difficulty with concentration as well and focusing on a particular task or object.  Patient and husband are interested in participating in outpatient speech therapy once able.  She continues on Plavix without side effects of bleeding  or bruising.  Continues on atorvastatin without side effects myalgias. Blood pressure was being monitored by speech therapy which has since ended therefore husband plans on obtaining a new BP machine as their machine was not accurate compared to office visit readings.  He does endorse previously checking BP prior to antihypertensives where office visit recordings were after administration of antihypertensives.  No further concerns at this time.  Denies new or worsening stroke/TIA symptoms.    INITIAL VISIT 11/20/2018: Copied from prior note for reference purposes only  JAIRE BECHERER is being seen today for initial visit in the office for left basal ganglia infarct secondary to small vessel disease on 10/14/2018. History obtained from patient, husband and chart review. Reviewed all radiology images and labs personally.  Ms. KATHRYNE YOUTZ is a72 y.o.femaleWith PMH significant for HTN, anxiety, and TIA who presented to [facility] with c/o confusion. CT head reviewed and was negative for acute infarct or hemorrhage. No TPA d/t outside of TPA window.CTA head was negative for LVO but did show severe right P2 stenosis and moderate PCA stenosis.  CTA neck showed patent vertebral arteries without significant stenosis in the ICAs.  MRI brain reviewed and showed acute left basal ganglia infarct.  2D echo was performed on 09/21/2018 which showed EF 65 to 70%.  LDL 113 and A1c 4.7.  Infarct etiology likely related to small vessel disease. Patient did present to [facility] ED on 09/20/2018 with complaints of tingling in her face and left hand.  As all imaging normal, patient was diagnosed with TIA and discharged home.  Patient on aspirin 81  mg PTA and recommended DAPT for 3 weeks and Plavix alone.  Atorvastatin 80 mg initiated after [facility] long ED visit and therefore recommended continuation for HLD management.  A1c 4.7 and recommended continue follow-up with PCP for DM management.  Patient discharged home in stable  condition with recommendation of home health PT/OT/ST.  Patient is being seen today for hospital follow-up and is accompanied by her husband.  She has completed 3 weeks DAPT and continues on Plavix alone without side effects of bleeding or bruising.  Continues on atorvastatin 80 mg without side effects of myalgias.  Blood pressure today satisfactory at 123/73.  Husband states they do check this at home with SBP ranging 1 60-1 80/70s.  Due to the great difference of today's blood pressure reading in the reading they obtained at home, it was recommended for him to bring his blood pressure cuff at next provider appointment in order to ensure accuracy.  She states overall she has been doing well with great improvement of her cognitive abilities.  She has recently completed home health speech therapy but per husband, therapist recommended additional therapy sessions and he is still waiting at this time to see if they were approved for additional sessions.  He does plan on calling them today or Monday for an update.  She has continued doing home exercises on her own and with husband that were recommended during therapy sessions.  No further concerns at this time.  Denies new or worsening stroke/TIA symptoms.    ROS:   14 system review of systems performed and negative with exception of confusion and memory loss  PMH:  Past Medical History:  Diagnosis Date  . CVA (cerebral vascular accident) (HCC) 10/14/2018  . Depression   . GERD (gastroesophageal reflux disease)   . Stress   . TIA (transient ischemic attack) 09/21/2018    PSH:  Past Surgical History:  Procedure Laterality Date  . BREAST EXCISIONAL BIOPSY Left 2010   benign    Social History:  Social History   Socioeconomic History  . Marital status: Married    Spouse name: Not on file  . Number of children: Not on file  . Years of education: Not on file  . Highest education level: Not on file  Occupational History  . Not on file   Social Needs  . Financial resource strain: Not on file  . Food insecurity:    Worry: Not on file    Inability: Not on file  . Transportation needs:    Medical: Not on file    Non-medical: Not on file  Tobacco Use  . Smoking status: Former Games developer  . Smokeless tobacco: Never Used  Substance and Sexual Activity  . Alcohol use: Not on file  . Drug use: Not on file  . Sexual activity: Not on file  Lifestyle  . Physical activity:    Days per week: Not on file    Minutes per session: Not on file  . Stress: Not on file  Relationships  . Social connections:    Talks on phone: Not on file    Gets together: Not on file    Attends religious service: Not on file    Active member of club or organization: Not on file    Attends meetings of clubs or organizations: Not on file    Relationship status: Not on file  . Intimate partner violence:    Fear of current or ex partner: Not on file    Emotionally abused: Not  on file    Physically abused: Not on file    Forced sexual activity: Not on file  Other Topics Concern  . Not on file  Social History Narrative  . Not on file    Family History:  Family History  Family history unknown: Yes    Medications:   Current Outpatient Medications on File Prior to Visit  Medication Sig Dispense Refill  . atorvastatin (LIPITOR) 80 MG tablet Take 1 tablet (80 mg total) by mouth daily at 6 PM. 30 tablet 3  . clopidogrel (PLAVIX) 75 MG tablet Take 1 tablet (75 mg total) by mouth daily. 30 tablet 0  . escitalopram (LEXAPRO) 5 MG tablet Take 5 mg by mouth daily.  12  . metoprolol tartrate (LOPRESSOR) 25 MG tablet Take 1 tablet (25 mg total) by mouth 2 (two) times daily. 60 tablet 1  . metroNIDAZOLE (METROCREAM) 0.75 % cream Apply 1 application topically 2 (two) times daily as needed (irritation).   2  . pantoprazole (PROTONIX) 40 MG tablet Take 40 mg by mouth daily.  12  . quinapril (ACCUPRIL) 20 MG tablet Take 1 tablet (20 mg total) by mouth 2 (two)  times daily. 60 tablet 0  . quinapril-hydrochlorothiazide (ACCURETIC) 20-25 MG tablet Take 1 tablet by mouth daily.    Marland Kitchen venlafaxine XR (EFFEXOR-XR) 37.5 MG 24 hr capsule Take 37.5 mg by mouth daily with breakfast.   3   No current facility-administered medications on file prior to visit.     Allergies:   Allergies  Allergen Reactions  . Potassium-Containing Compounds Other (See Comments)    unknown  . Sulfur Other (See Comments)    unknown     Physical Exam  *Limited exam due to type of visit*  General: well developed, well nourished, very pleasant elderly Caucasian female, seated, in no evident distress Head: head normocephalic and atraumatic.    Neurologic Exam Mental Status: Awake and fully alert. Oriented to place and time. Remote memory intact. Attention span, concentration and fund of knowledge mostly appropriate. Mood and affect appropriate.  Able to perform serial additions.  Animal naming 9 in 60 seconds.  Recall 0/3. Cranial Nerves: Extraocular movements full without nystagmus. Face, tongue, palate moves normally and symmetrically.  Motor: No evidence of weakness per chart assessment Sensory.:  Unable to assess Coordination: Rapid alternating movements normal in all extremities. Finger-to-nose and heel-to-shin performed accurately bilaterally. Gait and Station: Arises from chair without difficulty. Stance is normal. Gait demonstrates normal stride length and balance.      Diagnostic Data (Labs, Imaging, Testing)  Ct Angio Head W Or Wo Contrast  Result Date: 10/14/2018  IMPRESSION: CTA NECK: 1. No hemodynamically significant stenosis ICA's. 2. Patent vertebral arteries. CTA HEAD: 1. No emergent large vessel occlusion. 2. Severe stenosis RIGHT P2 segment, moderate PCA atherosclerosis. Aortic Atherosclerosis (ICD10-I70.0).  Dg Chest 2 View  Result Date: 10/13/2018 CLINICAL DATA:  Confusion, shortness of breath. EXAM: CHEST - 2 VIEW COMPARISON:  Chest radiograph  September 20, 2018 FINDINGS: Cardiomediastinal silhouette is normal. Mildly calcified aortic arch. No pleural effusions or focal consolidations. Trachea projects midline and there is no pneumothorax. Soft tissue planes and included osseous structures are non-suspicious. Cervical facet arthropathy. IMPRESSION: Stable examination: No acute cardiopulmonary process. Aortic Atherosclerosis (ICD10-I70.0). Electronically Signed   By: Awilda Metro M.D.   On: 10/13/2018 22:24   Ct Head Wo Contrast  Result Date: 10/13/2018 . IMPRESSION: 1. Chronic white matter changes. No acute intracranial abnormalities.  Ct Angio Neck W Or  Wo Contrast   Result Date: 10/14/2018  IMPRESSION: CTA NECK: 1. No hemodynamically significant stenosis ICA's. 2. Patent vertebral arteries. CTA HEAD: 1. No emergent large vessel occlusion. 2. Severe stenosis RIGHT P2 segment, moderate PCA atherosclerosis. Aortic Atherosclerosis (ICD10-I70.0)  Mr Brain Wo Contrast  Result Date: 10/14/2018  IMPRESSION: Acute infarct left basal ganglia, not present on the prior MRI. No hemorrhage Mild to moderate chronic microvascular ischemic changes throughout the cerebral white matter bilaterally.     ASSESSMENT: Elliot CousinJeanette S Fiscus is a 73 y.o. year old female here with left basal ganglia infarct on 10/14/2018 secondary to small vessel disease. Vascular risk factors include HTN, HLD and DM.  Telemedicine visit today for follow-up with residual post stroke cognitive deficits including concentration, short-term memory and performing simple tasks but overall endorses improvement since prior visit.    PLAN:  1. Left basal ganglia infarct: Continue clopidogrel 75 mg daily  and atorvastatin 80 mg for secondary stroke prevention. Maintain strict control of hypertension with blood pressure goal below 130/90, diabetes with hemoglobin A1c goal below 6.5% and cholesterol with LDL cholesterol (bad cholesterol) goal below 70 mg/dL.  I also advised the  patient to eat a healthy diet with plenty of whole grains, cereals, fruits and vegetables, exercise regularly with at least 30 minutes of continuous activity daily and maintain ideal body weight.  2. HTN: Advised to continue current treatment regimen with ongoing follow-up with PCP for management.  Highly encourage obtaining home BP cuff and to take reading 1 to 2 hours after medication administration as this is likely reasoning behind elevated numbers at home and normotensive during appointments.  Verbalized understanding. 3. HLD: Advised to continue current treatment regimen along with continued follow-up with PCP for future prescribing and monitoring of lipid panel.   4. Post stroke deficits: Encouraged ongoing mind exercises at home along with encouraging independence safely.  Advised to contact office if they would like to pursue additional speech therapy outpatient along with potential referral to neuropsychology Dr. Kieth Brightlyodenbough    Follow up in 6 months or call earlier if needed   Greater than 50% of time during this 25 minute visit was spent on counseling, explanation of diagnosis of left basal ganglia infarct, reviewing risk factor management of HTN, HLD and DM, planning of further management along with potential future management along with importance of memory exercises and regaining independence, and discussion with patient and family answering all questions.    George HughJessica VanSchaick, AGNP-BC  [facility] [address] [place], [place] [id number]  Phone [phone] Fax [phone] Note: This document was prepared with digital dictation and possible smart phrase technology. Any transcriptional errors that result from this process are unintentional.

## 2019-02-25 NOTE — Progress Notes (Signed)
I agree with the above plan 

## 2019-08-24 ENCOUNTER — Other Ambulatory Visit: Payer: Self-pay | Admitting: Family Medicine

## 2019-08-24 DIAGNOSIS — M8580 Other specified disorders of bone density and structure, unspecified site: Secondary | ICD-10-CM

## 2019-08-24 DIAGNOSIS — Z1231 Encounter for screening mammogram for malignant neoplasm of breast: Secondary | ICD-10-CM

## 2020-04-17 ENCOUNTER — Ambulatory Visit: Payer: Medicare Other | Admitting: Adult Health

## 2020-05-03 DIAGNOSIS — E039 Hypothyroidism, unspecified: Secondary | ICD-10-CM | POA: Diagnosis not present

## 2020-05-03 DIAGNOSIS — R946 Abnormal results of thyroid function studies: Secondary | ICD-10-CM | POA: Diagnosis not present

## 2020-05-03 DIAGNOSIS — N644 Mastodynia: Secondary | ICD-10-CM | POA: Diagnosis not present

## 2020-05-05 ENCOUNTER — Other Ambulatory Visit: Payer: Self-pay | Admitting: Family Medicine

## 2020-05-05 DIAGNOSIS — N644 Mastodynia: Secondary | ICD-10-CM

## 2020-05-17 ENCOUNTER — Other Ambulatory Visit: Payer: Self-pay

## 2020-05-17 ENCOUNTER — Ambulatory Visit: Payer: Medicare Other | Admitting: Adult Health

## 2020-05-17 ENCOUNTER — Ambulatory Visit: Payer: Medicare PPO | Admitting: Adult Health

## 2020-05-17 ENCOUNTER — Encounter: Payer: Self-pay | Admitting: Adult Health

## 2020-05-17 VITALS — BP 130/71 | HR 57 | Ht 66.0 in | Wt 160.0 lb

## 2020-05-17 DIAGNOSIS — I639 Cerebral infarction, unspecified: Secondary | ICD-10-CM | POA: Diagnosis not present

## 2020-05-17 DIAGNOSIS — I10 Essential (primary) hypertension: Secondary | ICD-10-CM

## 2020-05-17 DIAGNOSIS — I6523 Occlusion and stenosis of bilateral carotid arteries: Secondary | ICD-10-CM

## 2020-05-17 DIAGNOSIS — E785 Hyperlipidemia, unspecified: Secondary | ICD-10-CM

## 2020-05-17 DIAGNOSIS — I69319 Unspecified symptoms and signs involving cognitive functions following cerebral infarction: Secondary | ICD-10-CM

## 2020-05-17 NOTE — Progress Notes (Signed)
[facility] [address] [place]. [place] [address] [phone]       STROKE FOLLOW UP NOTE  Ashley Buck Date of Birth:  1946/09/16 Medical Record Number:  [id number]   Reason for Referral: stroke follow up    CHIEF COMPLAINT:  Chief Complaint  Patient presents with  . Follow-up    1245 in tx rm here for a f/u    HPI:  Today, 05/17/2020, Ashley Buck returns for stroke follow-up accompanied by her husband.  She has not been seen in over the past year.  She reports she has done well since prior visit with residual cognitive impairment.  Both husband and patient report improvement since prior visit but continues to have difficulty with short-term memory, difficulty following multistep directions, decreased focus and becomes frustrated quickly.  Continues to work on exercises daily at home.  Denies new stroke/TIA symptoms.  Remains on Plavix and atorvastatin 80 mg daily for secondary stroke prevention.  Prior lipid panel 10/2018 showed LDL 40 and per patient, PCP recently obtained lipid panel which was satisfactory.  Blood pressure today 130/71.  No further concerns at this time.    History copied from prior note for reference purposes only Virtual visit 02/24/2019 JM: Since prior visit, patient has been stable without any new or worsening stroke/TIA symptoms.  She states her overall cognitive deficit has improved since prior visit but continues to have mild difficulties with simple activities and short-term memory loss.  She has recently completed home speech therapy and continues to work daily with her husband and memory exercises and regaining her independence.  She currently needs assistance with IADLs but is able to maintain ADLs independently.  Husband does endorse difficulty with concentration as well and focusing on a particular task or object.  Patient and husband are interested in participating in outpatient speech therapy once able.  She continues on  Plavix without side effects of bleeding or bruising.  Continues on atorvastatin without side effects myalgias. Blood pressure was being monitored by speech therapy which has since ended therefore husband plans on obtaining a new BP machine as their machine was not accurate compared to office visit readings.  He does endorse previously checking BP prior to antihypertensives where office visit recordings were after administration of antihypertensives.  No further concerns at this time.  Denies new or worsening stroke/TIA symptoms.  Initial visit 11/20/2018 JM: Patient is being seen today for hospital follow-up and is accompanied by her husband.  She has completed 3 weeks DAPT and continues on Plavix alone without side effects of bleeding or bruising.  Continues on atorvastatin 80 mg without side effects of myalgias.  Blood pressure today satisfactory at 123/73.  Husband states they do check this at home with SBP ranging 1 60-1 80/70s.  Due to the great difference of today's blood pressure reading in the reading they obtained at home, it was recommended for him to bring his blood pressure cuff at next provider appointment in order to ensure accuracy.  She states overall she has been doing well with great improvement of her cognitive abilities.  She has recently completed home health speech therapy but per husband, therapist recommended additional therapy sessions and he is still waiting at this time to see if they were approved for additional sessions.  He does plan on calling them today or Monday for an update.  She has continued doing home exercises on her own and with husband that were recommended during therapy sessions.  No  further concerns at this time.  Denies new or worsening stroke/TIA symptoms.  Stroke admission 10/14/2018: Ashley Buck is a72 y.o.femaleWith PMH significant for HTN, anxiety, and TIA who presented to [facility] on 10/14/2018 with c/o confusion. CT head reviewed and was negative for  acute infarct or hemorrhage.    LDL 113 and A1c 4.7.  Infarct etiology likely related to small vessel disease. Patient on aspirin 81 mg PTA and recommended DAPT for 3 weeks and Plavix alone.  Atorvastatin 80 mg initiated after [facility] long ED visit (3 weeks prior) and therefore recommended continuation for HLD management.  A1c 4.7.  Patient discharged home in stable condition with recommendation of home health PT/OT/ST.  Stroke:  left basal ganglia infarct embolic secondary to small vessel disease source  CT head no hemorrhage; chronic white matter changes  CTA head NO LVO; severe R P2 stenosis, moderate PCA stenosis  CTA neck: patent vertebral arteries; no significant stenosis ICA's  MRI   Acute left basal ganglia infarct; no hemorrhage  Carotid Doppler  09/21/18: RICA 40-59% stenosis, LICA 1-39% stenosis  2D Echo  09/21/18: Left ventricle: The cavity size was normal. Wall thickness was increased in a pattern of mild LVH. Systolic function wasvigorous. The estimated ejection fraction was in the range of 65%to 70%. Mitral valve: Calcified annulus. Mildly thickened leaflets. Pericardium, extracardiac: Small pericardial effusion surroundsheart.  LDL 113  HgbA1c 4.7  Lovenox for VTE prophylaxis  aspirin 81 mg daily prior to admission, now on aspirin 81 mg daily and clopidogrel 75 mg daily. Continue ASA and plavix for 3 weeks and then stop ASA and continue on Plavix alone.   Therapy recommendations:  SLP: home health SLP; 24 hr supervision  Disposition:  Home    ROS:   14 system review of systems performed and negative with exception of confusion and memory loss  PMH:  Past Medical History:  Diagnosis Date  . CVA (cerebral vascular accident) (HCC) 10/14/2018  . Depression   . GERD (gastroesophageal reflux disease)   . Stress   . TIA (transient ischemic attack) 09/21/2018    PSH:  Past Surgical History:  Procedure Laterality Date  . BREAST EXCISIONAL BIOPSY Left 2010    benign    Social History:  Social History   Socioeconomic History  . Marital status: Married    Spouse name: Not on file  . Number of children: Not on file  . Years of education: Not on file  . Highest education level: Not on file  Occupational History  . Not on file  Tobacco Use  . Smoking status: Former Games developer  . Smokeless tobacco: Never Used  Substance and Sexual Activity  . Alcohol use: Not on file  . Drug use: Not on file  . Sexual activity: Not on file  Other Topics Concern  . Not on file  Social History Narrative  . Not on file   Social Determinants of Health   Financial Resource Strain:   . Difficulty of Paying Living Expenses:   Food Insecurity:   . Worried About Programme researcher, broadcasting/film/video in the Last Year:   . Barista in the Last Year:   Transportation Needs:   . Freight forwarder (Medical):   Marland Kitchen Lack of Transportation (Non-Medical):   Physical Activity:   . Days of Exercise per Week:   . Minutes of Exercise per Session:   Stress:   . Feeling of Stress :   Social Connections:   . Frequency of  Communication with Friends and Family:   . Frequency of Social Gatherings with Friends and Family:   . Attends Religious Services:   . Active Member of Clubs or Organizations:   . Attends Banker Meetings:   Marland Kitchen Marital Status:   Intimate Partner Violence:   . Fear of Current or Ex-Partner:   . Emotionally Abused:   Marland Kitchen Physically Abused:   . Sexually Abused:     Family History:  Family History  Family history unknown: Yes    Medications:   Current Outpatient Medications on File Prior to Visit  Medication Sig Dispense Refill  . atorvastatin (LIPITOR) 80 MG tablet Take 1 tablet (80 mg total) by mouth daily at 6 PM. 30 tablet 3  . clopidogrel (PLAVIX) 75 MG tablet Take 1 tablet (75 mg total) by mouth daily. 30 tablet 0  . escitalopram (LEXAPRO) 5 MG tablet Take 5 mg by mouth daily.  12  . levothyroxine (SYNTHROID) 25 MCG tablet Take 25 mcg  by mouth daily before breakfast.    . metoprolol tartrate (LOPRESSOR) 25 MG tablet Take 1 tablet (25 mg total) by mouth 2 (two) times daily. 60 tablet 1  . metroNIDAZOLE (METROCREAM) 0.75 % cream Apply 1 application topically 2 (two) times daily as needed (irritation).   2  . pantoprazole (PROTONIX) 40 MG tablet Take 40 mg by mouth daily.  12  . quinapril-hydrochlorothiazide (ACCURETIC) 20-25 MG tablet Take 1 tablet by mouth daily.     No current facility-administered medications on file prior to visit.    Allergies:   Allergies  Allergen Reactions  . Potassium-Containing Compounds Other (See Comments)    unknown  . Sulfur Other (See Comments)    unknown     Physical Exam  Today's Vitals   05/17/20 1232 05/17/20 1235  BP: (!) 150/69 130/71  Pulse: (!) 57 (!) 57  Weight: 160 lb (72.6 kg)   Height: 5\' 6"  (1.676 m)    Body mass index is 25.82 kg/m.  General: well developed, well nourished,  pleasant elderly Caucasian female, seated, in no evident distress Head: head normocephalic and atraumatic.   Neck: supple with no carotid or supraclavicular bruits Cardiovascular: regular rate and rhythm, no murmurs Musculoskeletal: no deformity Skin:  no rash/petichiae Vascular:  Normal pulses all extremities   Neurologic Exam Mental Status: Awake and fully alert.  Fluent speech and language. Oriented to place and time. Recent memory impaired and remote memory intact. Attention span, concentration and fund of knowledge appropriate during visit. Mood and affect appropriate.  Cranial Nerves: Pupils equal, briskly reactive to light. Extraocular movements full without nystagmus. Visual fields full to confrontation. Hearing intact. Facial sensation intact. Face, tongue, palate moves normally and symmetrically.  Motor: Normal bulk and tone. Normal strength in all tested extremity muscles. Sensory.: intact to touch , pinprick , position and vibratory sensation.  Coordination: Rapid alternating  movements normal in all extremities. Finger-to-nose and heel-to-shin performed accurately bilaterally. Gait and Station: Arises from chair without difficulty. Stance is normal. Gait demonstrates normal stride length and balance Reflexes: 1+ and symmetric. Toes downgoing.         ASSESSMENT: KEYLEEN CERRATO is a 74 y.o. year old female here with left basal ganglia infarct on 10/14/2018 secondary to small vessel disease. Vascular risk factors include bilateral carotid stenosis, HTN, HLD and DM.      PLAN:  1. Left BG stroke -Cognitive impairment, poststroke  -Discussion with husband regarding continued cognitive impairment which is likely her new  baseline  -Highly encouraged continued brain exercises at home  -Discuss possible evaluation with neuropsychology -they plan on speaking about this further and will call office if interested in pursuing -Continue clopidogrel 75 mg daily  and atorvastatin 80 mg for secondary stroke prevention.  -Continue to follow with PCP for HTN, HLD and DM management as well as ongoing prescribing of atorvastatin -blood pressure goal below 130/90, A1c goal below 6.5% and LDL cholesterol  goal below 70 mg/dL.   2. Bilateral carotid stenosis -08/2018 CUS right ICA 40 to 59% stenosis and left ICA 1 to 39% stenosis -Repeat ultrasound for surveillance monitoring    Follow up in 6 months or call earlier if needed   I spent 35 minutes of face-to-face and non-face-to-face time with patient and husband.  This included previsit chart review, lab review, study review, order entry, electronic health record documentation, patient education regarding history of stroke with residual cognitive impairment, importance of managing stroke risk factors, bilateral carotid stenosis and answered all questions to patient and husband satisfaction   Frann Rider, AGNP-BC  [facility] [address] [place], [place] [address]  Phone  [phone] Fax [phone] Note: This document was prepared with digital dictation and possible smart phrase technology. Any transcriptional errors that result from this process are unintentional.

## 2020-05-17 NOTE — Patient Instructions (Signed)
Continue to do brain exercises at home as well as compensate for memory impairment.  Please call office if interested in pursuing evaluation with neuropsychologist Dr. Kieth Brightly  Continue clopidogrel 75 mg daily  and atorvastatin  for secondary stroke prevention  Continue to follow up with PCP regarding cholesterol and blood pressure management   Repeat carotid dopplers for known carotid stenosis - you will be called to schedule imaging  Continue to monitor blood pressure at home  Maintain strict control of hypertension with blood pressure goal below 130/90, diabetes with hemoglobin A1c goal below 6.5% and cholesterol with LDL cholesterol (bad cholesterol) goal below 70 mg/dL. I also advised the patient to eat a healthy diet with plenty of whole grains, cereals, fruits and vegetables, exercise regularly and maintain ideal body weight.  Followup in the future with me in 6 months or call earlier if needed       Thank you for coming to see [place] at [facility]. I hope we have been able to provide you high quality care today.  You may receive a patient satisfaction survey over the next few weeks. We would appreciate your feedback and comments so that we may continue to improve ourselves and the health of our patients.

## 2020-05-18 ENCOUNTER — Ambulatory Visit: Payer: Medicare Other | Admitting: Adult Health

## 2020-05-19 NOTE — Progress Notes (Signed)
I agree with the above plan 

## 2020-05-24 ENCOUNTER — Ambulatory Visit (HOSPITAL_COMMUNITY)
Admission: RE | Admit: 2020-05-24 | Discharge: 2020-05-24 | Disposition: A | Discharge status: Home / Self Care | Payer: Medicare PPO | Source: Ambulatory Visit | Attending: Adult Health | Admitting: Adult Health

## 2020-05-24 ENCOUNTER — Other Ambulatory Visit: Payer: Self-pay

## 2020-05-24 DIAGNOSIS — I6523 Occlusion and stenosis of bilateral carotid arteries: Secondary | ICD-10-CM | POA: Diagnosis not present

## 2020-06-09 ENCOUNTER — Telehealth: Payer: Self-pay | Admitting: Adult Health

## 2020-06-09 NOTE — Telephone Encounter (Signed)
Pt's husband called wanting to know if the results of the pt's last test have come in. Please advise.

## 2020-06-12 NOTE — Telephone Encounter (Signed)
Please advise patient that carotid ultrasound showed stable appearance compared to prior ultrasound.  We will continue medical management with current regimen.  Thank you.

## 2020-06-12 NOTE — Telephone Encounter (Signed)
Carotid US results in Epic dated 05-24-20.

## 2020-06-20 ENCOUNTER — Ambulatory Visit
Admission: RE | Admit: 2020-06-20 | Discharge: 2020-06-20 | Disposition: A | Discharge status: Home / Self Care | Payer: Medicare PPO | Source: Ambulatory Visit | Attending: Family Medicine | Admitting: Family Medicine

## 2020-06-20 ENCOUNTER — Other Ambulatory Visit: Payer: Self-pay

## 2020-06-20 DIAGNOSIS — Z1231 Encounter for screening mammogram for malignant neoplasm of breast: Secondary | ICD-10-CM | POA: Diagnosis not present

## 2020-08-11 DIAGNOSIS — E039 Hypothyroidism, unspecified: Secondary | ICD-10-CM | POA: Diagnosis not present

## 2020-08-15 DIAGNOSIS — Z808 Family history of malignant neoplasm of other organs or systems: Secondary | ICD-10-CM | POA: Diagnosis not present

## 2020-08-15 DIAGNOSIS — D2272 Melanocytic nevi of left lower limb, including hip: Secondary | ICD-10-CM | POA: Diagnosis not present

## 2020-08-15 DIAGNOSIS — D2261 Melanocytic nevi of right upper limb, including shoulder: Secondary | ICD-10-CM | POA: Diagnosis not present

## 2020-08-15 DIAGNOSIS — Z86018 Personal history of other benign neoplasm: Secondary | ICD-10-CM | POA: Diagnosis not present

## 2020-08-15 DIAGNOSIS — D225 Melanocytic nevi of trunk: Secondary | ICD-10-CM | POA: Diagnosis not present

## 2020-08-15 DIAGNOSIS — L821 Other seborrheic keratosis: Secondary | ICD-10-CM | POA: Diagnosis not present

## 2020-08-17 IMAGING — CT CT ANGIO NECK
1 of 8 series · 6 of 33 positions shown · IV contrast (OMNI 350)
Comparison: MRI head October 14, 2018 and MRA head September 21, 2018.

CLINICAL DATA: Follow up stroke.

EXAM:
CT ANGIOGRAPHY HEAD AND NECK
TECHNIQUE: Multidetector CT imaging of the head and neck was performed using
the standard protocol during bolus administration of intravenous
contrast. Multiplanar CT image reconstructions and MIPs were
obtained to evaluate the vascular anatomy. Carotid stenosis
measurements (when applicable) are obtained utilizing NASCET
criteria, using the distal internal carotid diameter as the
denominator.
CONTRAST:  75mL [id number] IOPAMIDOL ([id number]) INJECTION 76%

[Series 7: cta neck axial · axial · 0.39mm/px · z∈[-312,-46]mm · 6 of 370 slices shown]
[im 53/370  soft-tissue]
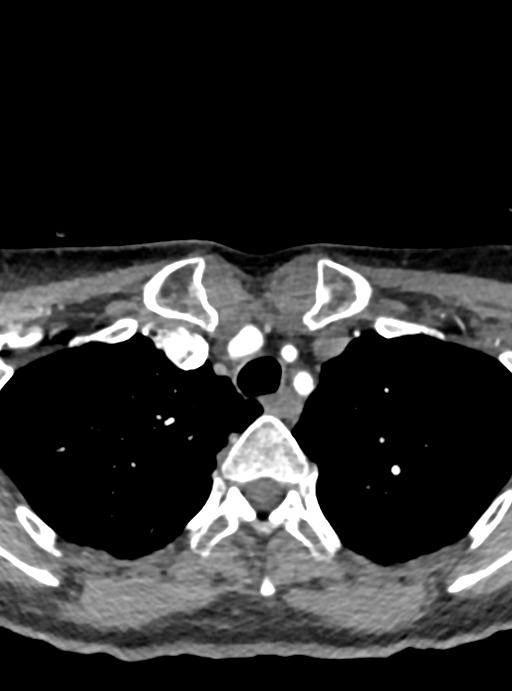
[im 106/370  bone]
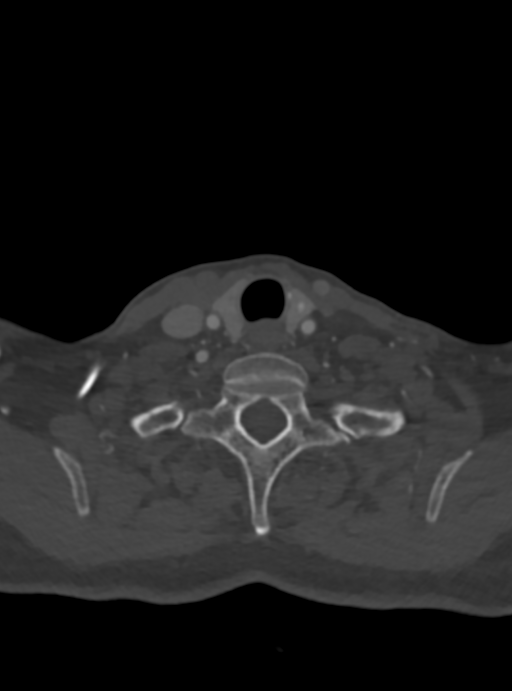
[im 159/370  soft-tissue]
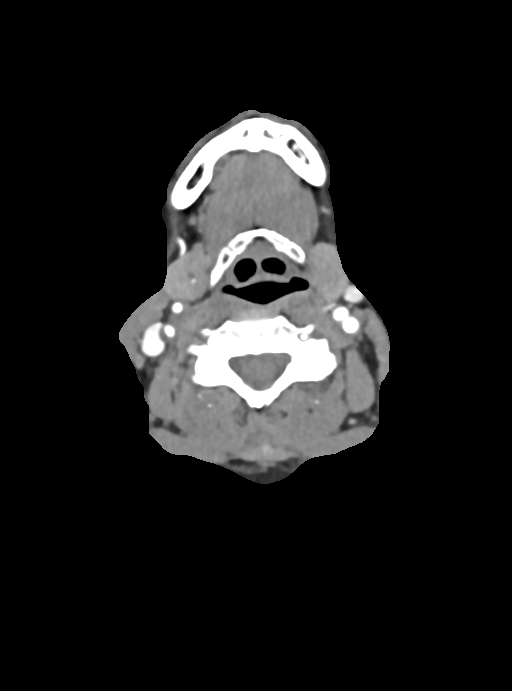
[im 211/370  bone]
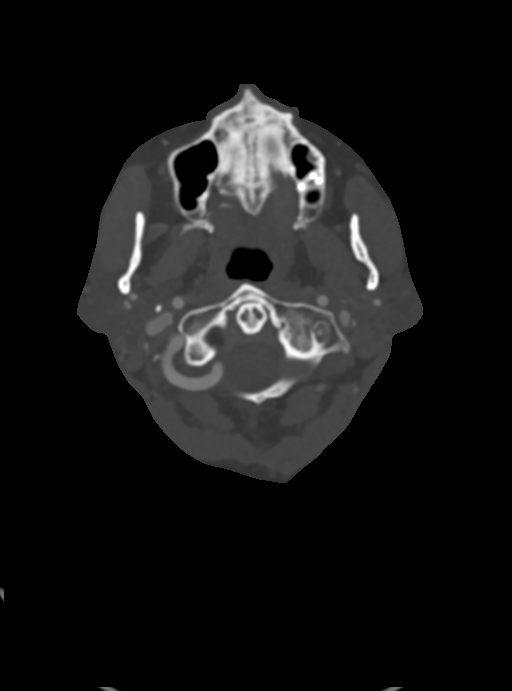
[im 264/370  soft-tissue]
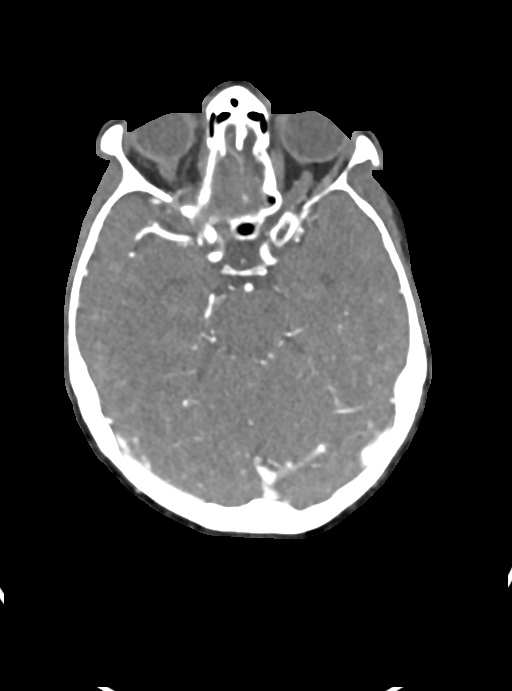
[im 317/370  bone]
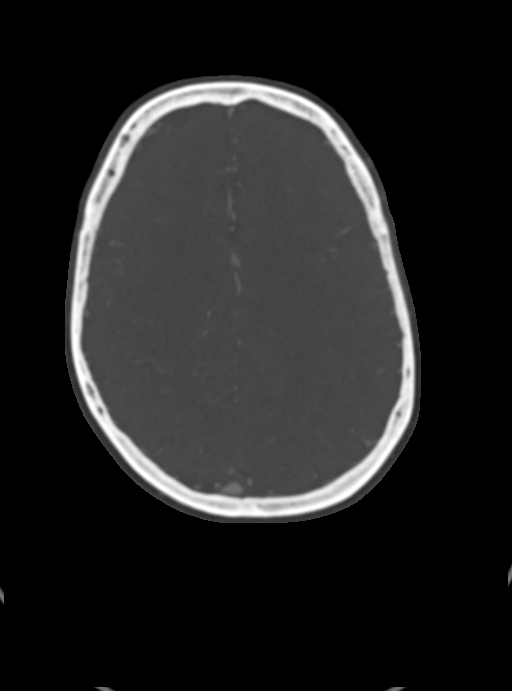

[6 of 33 positions shown; findings below may reference images not displayed]

FINDINGS: CTA NECK FINDINGS:

AORTIC ARCH: Normal appearance of the thoracic arch, normal branch
pattern. Moderate calcific atherosclerosis aortic arch. The origins
of the innominate, left Common carotid artery and subclavian artery
are widely patent.

RIGHT CAROTID SYSTEM: Common carotid artery is patent. Minimal
calcific atherosclerosis of the carotid bifurcation without
hemodynamically significant stenosis by NASCET criteria. Normal
appearance of the internal carotid artery.

LEFT CAROTID SYSTEM: Common carotid artery is patent. Normal
appearance of the carotid bifurcation without hemodynamically
significant stenosis by NASCET criteria. Normal appearance of the
internal carotid artery.

VERTEBRAL ARTERIES:RIGHT vertebral artery is dominant. Patent
vertebral arteries mild extrinsic deformity due to degenerative
cervical spine.

SKELETON: No acute osseous process though bone windows have not been
submitted. Subcentimeter calcified LEFT thyroid nodule, no routine
indicated follow-up by consensus.

OTHER NECK: Soft tissues of the neck are nonacute though, not
tailored for evaluation. Severe bilateral C4-5 and RIGHT C5-6 neural
foraminal narrowing. Neural foraminal narrowing.

UPPER CHEST: Included lung apices are clear. No superior mediastinal
lymphadenopathy.

CTA HEAD FINDINGS:

ANTERIOR CIRCULATION: Patent cervical internal carotid arteries,
petrous, cavernous and supra clinoid internal carotid arteries.
Patent anterior communicating artery. Patent anterior and middle
cerebral arteries, mild luminal irregularity compatible with
atherosclerosis.

No large vessel occlusion, significant stenosis, contrast
extravasation or aneurysm.

POSTERIOR CIRCULATION: Patent vertebral arteries, vertebrobasilar
junction and basilar artery, as well as main branch vessels. LEFT
vertebral artery predominantly terminates in the posterior inferior
cerebellar artery. Patent posterior cerebral arteries, tandem
moderate stenosis. Severe stenosis RIGHT distal P2.

No large vessel occlusion, significant stenosis, contrast
extravasation or aneurysm.

VENOUS SINUSES: Major dural venous sinuses are patent though not
tailored for evaluation on this angiographic examination.

ANATOMIC VARIANTS: None.

DELAYED PHASE: No abnormal intracranial enhancement.

MIP images reviewed.
IMPRESSION: CTA NECK:

1. No hemodynamically significant stenosis ICA's.
2. Patent vertebral arteries.

CTA HEAD:

1. No emergent large vessel occlusion.
2. Severe stenosis RIGHT P2 segment, moderate PCA atherosclerosis.

Aortic Atherosclerosis ([id number].P).

## 2020-08-30 ENCOUNTER — Other Ambulatory Visit: Payer: Self-pay | Admitting: Family Medicine

## 2020-08-30 DIAGNOSIS — K219 Gastro-esophageal reflux disease without esophagitis: Secondary | ICD-10-CM | POA: Diagnosis not present

## 2020-08-30 DIAGNOSIS — E7800 Pure hypercholesterolemia, unspecified: Secondary | ICD-10-CM | POA: Diagnosis not present

## 2020-08-30 DIAGNOSIS — Z0000 Encounter for general adult medical examination without abnormal findings: Secondary | ICD-10-CM | POA: Diagnosis not present

## 2020-08-30 DIAGNOSIS — E039 Hypothyroidism, unspecified: Secondary | ICD-10-CM | POA: Diagnosis not present

## 2020-08-30 DIAGNOSIS — I6930 Unspecified sequelae of cerebral infarction: Secondary | ICD-10-CM | POA: Diagnosis not present

## 2020-08-30 DIAGNOSIS — F39 Unspecified mood [affective] disorder: Secondary | ICD-10-CM | POA: Diagnosis not present

## 2020-08-30 DIAGNOSIS — I10 Essential (primary) hypertension: Secondary | ICD-10-CM | POA: Diagnosis not present

## 2020-08-30 DIAGNOSIS — Z1389 Encounter for screening for other disorder: Secondary | ICD-10-CM | POA: Diagnosis not present

## 2020-08-30 DIAGNOSIS — M8580 Other specified disorders of bone density and structure, unspecified site: Secondary | ICD-10-CM

## 2020-09-05 ENCOUNTER — Encounter

## 2020-09-20 DIAGNOSIS — H2513 Age-related nuclear cataract, bilateral: Secondary | ICD-10-CM | POA: Diagnosis not present

## 2020-09-20 DIAGNOSIS — H5213 Myopia, bilateral: Secondary | ICD-10-CM | POA: Diagnosis not present

## 2020-10-05 LAB — FECAL DNA COLORECTAL CANCER SCREENING (COLOGUARD): FIT-DNA (Cologuard): NEGATIVE

## 2020-10-11 ENCOUNTER — Inpatient Hospital Stay: Admit: 2020-10-11 | Payer: MEDICARE | Primary: Family Medicine

## 2020-10-11 ENCOUNTER — Ambulatory Visit
Admit: 2020-10-11 | Discharge: 2020-10-11 | Payer: MEDICARE | Attending: Critical Care Medicine | Primary: Family Medicine

## 2020-10-11 DIAGNOSIS — J8410 Pulmonary fibrosis, unspecified: Secondary | ICD-10-CM

## 2020-10-11 NOTE — Progress Notes (Signed)
REASON FOR THE CONSULTATION:  Chronic cough  HISTORY OF PRESENT ILLNESS:    Shelly [name] is a 74 y.o. year old female here for evaluation of cough which has been present since before February 2021.  Patient correlates it with start of a bronchitis.  She took antibiotics and prednisone without any improvement.  Cough is barky.  She has tried inhaler albuterol without any significant improvement did not have long-acting beta agonist or ICS.  Also albuterol causes mouth sores she is does not like to use it.  Cough is present throughout the day but worse when she lays down at night does not complain of GERD.  She has sputum in the morning which is frothy.  Denies wheezing no hemoptysis no history of asthma.  Has been using cough syrup cough drops without any improvement.  Does have dyspnea with exertion.  Her mother may have had lung disease.  Patient does not have birds or lived on a farm from [phone].          PAST MEDICAL HISTORY:       Diagnosis Date   ??? Skin cancer     basal cell-rt side upper lip         Family History:   History reviewed. No pertinent family history.    SURGICAL HISTORY:   Past Surgical History:   Procedure Laterality Date   ??? KNEE ARTHROPLASTY Left    ??? SHOULDER SURGERY Right    ??? TONSILLECTOMY     ??? TUBAL LIGATION             SOCIAL AND OCCUPATIONAL HEALTH:      There  No history of TB or TB exposure.    There  No asbestos ,[place] dust exposure.    The patient reports  No coal mine, [place], [place], [employer] exposure.   History of travel outside the country No  There  is use of   marijuana No   VapingNo  IV heroinNo  Crack cocaineNo  There  Nohot tub exposure.   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 She has never used smokeless tobacco.  ETOH:   has no history on file for alcohol use.      ALLERGIES:      Allergies   Allergen Reactions   ??? Atorvastatin Anaphylaxis   ???  Adhesive Tape Other (See Comments)   ??? Cephalexin Other (See Comments)         Home Meds:   Prior to Admission medications    Medication Sig Start Date End Date Taking? Authorizing Provider   amLODIPine (NORVASC) 5 MG tablet Take 5 mg by mouth daily 09/25/20 12/24/20 Yes Historical Provider, MD   aspirin 81 MG EC tablet daily   Yes Historical Provider, MD   hydroCHLOROthiazide (HYDRODIURIL) 25 MG tablet Take 25 mg by mouth daily 11/12/19  Yes Historical Provider, MD   simvastatin (ZOCOR) 20 MG tablet Take 20 mg by mouth nightly 05/15/20 05/15/21 Yes Historical Provider, MD   vitamin E 1000 units capsule Take 1,000 Units by mouth daily   Yes Historical Provider, MD   vitamin D 25 MCG (1000 UT) CAPS Take by mouth   Yes Historical Provider, MD              REVIEW OF SYSTEMS:    CONSTITUTIONAL:  negative  for  fevers, chills, sweats, fatigue, malaise, anorexia and weight loss  EYES:  negative for  double vision, blurred vision, dry eyes, eye discharge and redness  HEENT:  negative for  hearing loss, tinnitus, ear drainage, earaches and nasal congestion  RESPIRATORY:  See hpi  CARDIOVASCULAR:  negative for  chest pain,, palpitations, orthopnea, PND  GASTROINTESTINAL:  negative for nausea, vomiting, change in bowel habits, diarrhea, constipation, abdominal pain, pruritus, abdominal mass and abdominal distention  GENITOURINARY:  negative for frequency, dysuria, nocturia, urinary incontinence and hesitancy  INTEGUMENT  negative for rash, skin lesion(s), dryness, skin color change, changes in lesion, pruritus and changes in hair  HEMATOLOGIC/LYMPHATIC:  negative for easy bruising, bleeding, lymphadenopathy, petechiae and swelling/edema  ALLERGIC/IMMUNOLOGIC:  negative for recurrent infections, urticaria and drug reactions  ENDOCRINE:  negative for heat intolerance, cold intolerance, tremor, weight changes and change in bowel habits  MUSCULOSKELETAL:  negative for  myalgias, arthralgias, pain, joint swelling, stiff joints and  decreased range of motion  NEUROLOGICAL:  negative for headaches, dizziness, seizures, memory problems, speech problems, visual disturbance and coordination problems  BEHAVIOR/PSYCH:  negative for poor appetite, increased appetite, decreased sleep, increased sleep, decreased energy level, increased energy level and poor concentration  Skin no rash no dermatitis  Vitals:  BP (!) 188/84    Pulse 98    Temp 99 ??F (37.2 ??C)    Ht 5\' 6"  (1.676 m)    Wt 193 lb 6.4 oz (87.7 kg)    SpO2 96%    BMI 31.22 kg/m??     PHYSICAL EXAM:  General Appearance:    Alert, cooperative, no distress, appears stated age   Head:    Normocephalic, without obvious abnormality, atraumatic      Eyes:    PERRL, conjunctiva/corneas clear, EOM's intact   Ears:    Normal  external ear canals, both ears   Nose:   Nares normal, septum midline, mucosa normal, no drainage        or sinus tenderness   Throat:   Lips, mucosa, and tongue normal; teeth and gums normal   Neck:   Supple, symmetrical, trachea midline, no adenopathy;     thyroid:  no enlargement/tenderness/nodules; no carotid    bruit , noJVD   Back:     Symmetric, no curvature, ROM normal, no CVA tenderness   Lungs:    Bilateral Velcro rales no wheezing   Chest Wall:    No tenderness or deformity      Heart:    Regular rate and rhythm, S1 and S2 normal, no murmur, rub        or gallop no rvh                           Abdomen:                                                 Pulses:                              Skin:                  Lymph nodes:                    Neurologic:  Soft, non-tender, bowel sounds active all four quadrants,     no masses, no organomegaly         2+ and symmetric all extremities     Skin color, texture, turgor normal, no rashes or lesions       Cervical, supraclavicular not enlarged or matted or tender      CNII-XII intact, normal strength 5/5 .  Sensation grossly normal  and reflexes normal 2+  throughout     Clubbing No  Lower ext edema No  Upper ext  edema No                       IMPRESSION:     Diagnosis Orders   1. Pulmonary fibrosis (HCC)  ANA Screen with Reflex    Anti-Neutrophilic Cytoplasmic Antibody    Rheumatoid Factor    [place] Sedimentation Rate, Automated    CT CHEST HIGH RESOLUTION    Hypersensitivity pnuemonitis profile        :                PLAN:      Old x-ray chest reports reviewed in care everywhere concern for chronic interstitial changes.  On exam patient has bilateral fibrotic rales, PFT shows intrapulmonic restrictive lung disease.  Will obtain serological testing and HRCT for further evaluation.  Follow-up in clinic after above testing      Requested Prescriptions      No prescriptions requested or ordered in this encounter       There are no discontinued medications.    Marylu Lund received counseling on the following healthy behaviors: nutrition, exercise and medication adherence    Patient given educational materials : see patient instruction       Discussed use, benefit, and side effects of prescribed medications.  Barriers to medication compliance addressed.      All patient questions answered.  Pt voiced understanding.   I hope this updates you on my evaluation and clinical thinking. Thank you for allowing me to participate in his care.     Sincerely,      Electronically signed by Benetta Spar, MD on   10/11/20 at 1:32 PM EST       Please note that this chart was generated using voice recognition Dragon dictation software.  Although every effort was made to ensure the accuracy of this automated transcription, some errors in transcription may have occurred.

## 2020-10-12 LAB — ANTI-NEUTROPHILIC CYTOPLASMIC ANTIBODY
ANCA Myeloperoxidase: <0.3 AU/mL (ref 0.0–3.5)
ANCA Proteinase 3: <0.7 AU/mL (ref 0.0–2.0)

## 2020-10-12 LAB — RHEUMATOID FACTOR: Rheumatoid Factor: <10 IU/mL (ref <14)

## 2020-10-12 LAB — ANA SCREEN WITH REFLEX
ANA: NEGATIVE
Anti ds DNA: 2.7 IU/mL (ref <10.0)
ENA Antibodies Screen: 0.4 U/mL (ref <0.7)

## 2020-10-16 LAB — HYPERSENSITIVITY PNUEMONITIS PROFILE
A fumigatus #1: NOT DETECTED
A fumigatus #2: NOT DETECTED
A fumigatus #3: NOT DETECTED
A fumigatus #6: NOT DETECTED
A. Pullulans Abs: NOT DETECTED
Allergen, Pork: <0.1 kU/L (ref <=0.34)
Aspergillus flavus: NOT DETECTED
Beef: <0.1 kU/L (ref <=0.34)
Feather mix: NEGATIVE kU/L
M faeni: NOT DETECTED
Phoma betae: <0.1 kU/L (ref <=0.34)
Pigeon Serum: NOT DETECTED
S viridis: NOT DETECTED
T candidus: NOT DETECTED

## 2020-10-18 ENCOUNTER — Inpatient Hospital Stay: Admit: 2020-10-18 | Payer: MEDICARE | Primary: Family Medicine

## 2020-10-18 DIAGNOSIS — J8410 Pulmonary fibrosis, unspecified: Secondary | ICD-10-CM

## 2020-11-08 ENCOUNTER — Encounter: Attending: Critical Care Medicine | Primary: Family Medicine

## 2020-12-13 ENCOUNTER — Encounter: Attending: Critical Care Medicine | Primary: Family Medicine

## 2020-12-16 DIAGNOSIS — Z111 Encounter for screening for respiratory tuberculosis: Secondary | ICD-10-CM | POA: Diagnosis not present

## 2020-12-18 DIAGNOSIS — Z111 Encounter for screening for respiratory tuberculosis: Secondary | ICD-10-CM | POA: Diagnosis not present

## 2020-12-19 ENCOUNTER — Other Ambulatory Visit: Payer: Self-pay

## 2020-12-19 ENCOUNTER — Ambulatory Visit
Admission: RE | Admit: 2020-12-19 | Discharge: 2020-12-19 | Disposition: A | Discharge status: Home / Self Care | Payer: Medicare PPO | Source: Ambulatory Visit | Attending: Family Medicine | Admitting: Family Medicine

## 2020-12-19 DIAGNOSIS — M8580 Other specified disorders of bone density and structure, unspecified site: Secondary | ICD-10-CM

## 2020-12-19 DIAGNOSIS — M85851 Other specified disorders of bone density and structure, right thigh: Secondary | ICD-10-CM | POA: Diagnosis not present

## 2020-12-19 DIAGNOSIS — Z780 Asymptomatic menopausal state: Secondary | ICD-10-CM | POA: Diagnosis not present

## 2020-12-25 DIAGNOSIS — Z03818 Encounter for observation for suspected exposure to other biological agents ruled out: Secondary | ICD-10-CM | POA: Diagnosis not present

## 2020-12-29 DIAGNOSIS — Z03818 Encounter for observation for suspected exposure to other biological agents ruled out: Secondary | ICD-10-CM | POA: Diagnosis not present

## 2021-01-01 DIAGNOSIS — Z7902 Long term (current) use of antithrombotics/antiplatelets: Secondary | ICD-10-CM | POA: Diagnosis not present

## 2021-01-01 DIAGNOSIS — I10 Essential (primary) hypertension: Secondary | ICD-10-CM | POA: Diagnosis not present

## 2021-01-01 DIAGNOSIS — I69311 Memory deficit following cerebral infarction: Secondary | ICD-10-CM | POA: Diagnosis not present

## 2021-01-01 DIAGNOSIS — K219 Gastro-esophageal reflux disease without esophagitis: Secondary | ICD-10-CM | POA: Diagnosis not present

## 2021-01-01 DIAGNOSIS — E782 Mixed hyperlipidemia: Secondary | ICD-10-CM | POA: Diagnosis not present

## 2021-01-01 DIAGNOSIS — F330 Major depressive disorder, recurrent, mild: Secondary | ICD-10-CM | POA: Diagnosis not present

## 2021-01-01 DIAGNOSIS — E039 Hypothyroidism, unspecified: Secondary | ICD-10-CM | POA: Diagnosis not present

## 2021-01-02 DIAGNOSIS — Z03818 Encounter for observation for suspected exposure to other biological agents ruled out: Secondary | ICD-10-CM | POA: Diagnosis not present

## 2021-01-05 DIAGNOSIS — Z03818 Encounter for observation for suspected exposure to other biological agents ruled out: Secondary | ICD-10-CM | POA: Diagnosis not present

## 2021-01-08 DIAGNOSIS — I69311 Memory deficit following cerebral infarction: Secondary | ICD-10-CM | POA: Diagnosis not present

## 2021-01-08 DIAGNOSIS — Z7902 Long term (current) use of antithrombotics/antiplatelets: Secondary | ICD-10-CM | POA: Diagnosis not present

## 2021-01-08 DIAGNOSIS — E039 Hypothyroidism, unspecified: Secondary | ICD-10-CM | POA: Diagnosis not present

## 2021-01-08 DIAGNOSIS — E782 Mixed hyperlipidemia: Secondary | ICD-10-CM | POA: Diagnosis not present

## 2021-01-08 DIAGNOSIS — K219 Gastro-esophageal reflux disease without esophagitis: Secondary | ICD-10-CM | POA: Diagnosis not present

## 2021-01-08 DIAGNOSIS — F330 Major depressive disorder, recurrent, mild: Secondary | ICD-10-CM | POA: Diagnosis not present

## 2021-01-08 DIAGNOSIS — I10 Essential (primary) hypertension: Secondary | ICD-10-CM | POA: Diagnosis not present

## 2021-01-13 DIAGNOSIS — K219 Gastro-esophageal reflux disease without esophagitis: Secondary | ICD-10-CM | POA: Diagnosis not present

## 2021-01-13 DIAGNOSIS — Z87891 Personal history of nicotine dependence: Secondary | ICD-10-CM | POA: Diagnosis not present

## 2021-01-13 DIAGNOSIS — E7800 Pure hypercholesterolemia, unspecified: Secondary | ICD-10-CM | POA: Diagnosis not present

## 2021-01-13 DIAGNOSIS — E063 Autoimmune thyroiditis: Secondary | ICD-10-CM | POA: Diagnosis not present

## 2021-01-13 DIAGNOSIS — I10 Essential (primary) hypertension: Secondary | ICD-10-CM | POA: Diagnosis not present

## 2021-01-13 DIAGNOSIS — Z7902 Long term (current) use of antithrombotics/antiplatelets: Secondary | ICD-10-CM | POA: Diagnosis not present

## 2021-01-13 DIAGNOSIS — F0390 Unspecified dementia without behavioral disturbance: Secondary | ICD-10-CM | POA: Diagnosis not present

## 2021-01-13 DIAGNOSIS — Z8673 Personal history of transient ischemic attack (TIA), and cerebral infarction without residual deficits: Secondary | ICD-10-CM | POA: Diagnosis not present

## 2021-01-13 DIAGNOSIS — F419 Anxiety disorder, unspecified: Secondary | ICD-10-CM | POA: Diagnosis not present

## 2021-01-15 DIAGNOSIS — I10 Essential (primary) hypertension: Secondary | ICD-10-CM | POA: Diagnosis not present

## 2021-01-15 DIAGNOSIS — Z7902 Long term (current) use of antithrombotics/antiplatelets: Secondary | ICD-10-CM | POA: Diagnosis not present

## 2021-01-15 DIAGNOSIS — Z8673 Personal history of transient ischemic attack (TIA), and cerebral infarction without residual deficits: Secondary | ICD-10-CM | POA: Diagnosis not present

## 2021-01-15 DIAGNOSIS — E7800 Pure hypercholesterolemia, unspecified: Secondary | ICD-10-CM | POA: Diagnosis not present

## 2021-01-15 DIAGNOSIS — E063 Autoimmune thyroiditis: Secondary | ICD-10-CM | POA: Diagnosis not present

## 2021-01-15 DIAGNOSIS — F0390 Unspecified dementia without behavioral disturbance: Secondary | ICD-10-CM | POA: Diagnosis not present

## 2021-01-15 DIAGNOSIS — Z87891 Personal history of nicotine dependence: Secondary | ICD-10-CM | POA: Diagnosis not present

## 2021-01-15 DIAGNOSIS — K219 Gastro-esophageal reflux disease without esophagitis: Secondary | ICD-10-CM | POA: Diagnosis not present

## 2021-01-15 DIAGNOSIS — F419 Anxiety disorder, unspecified: Secondary | ICD-10-CM | POA: Diagnosis not present

## 2021-01-17 DIAGNOSIS — Z8673 Personal history of transient ischemic attack (TIA), and cerebral infarction without residual deficits: Secondary | ICD-10-CM | POA: Diagnosis not present

## 2021-01-17 DIAGNOSIS — E559 Vitamin D deficiency, unspecified: Secondary | ICD-10-CM | POA: Diagnosis not present

## 2021-01-17 DIAGNOSIS — Z7902 Long term (current) use of antithrombotics/antiplatelets: Secondary | ICD-10-CM | POA: Diagnosis not present

## 2021-01-17 DIAGNOSIS — E063 Autoimmune thyroiditis: Secondary | ICD-10-CM | POA: Diagnosis not present

## 2021-01-17 DIAGNOSIS — I69351 Hemiplegia and hemiparesis following cerebral infarction affecting right dominant side: Secondary | ICD-10-CM | POA: Diagnosis not present

## 2021-01-17 DIAGNOSIS — Z87891 Personal history of nicotine dependence: Secondary | ICD-10-CM | POA: Diagnosis not present

## 2021-01-17 DIAGNOSIS — E7800 Pure hypercholesterolemia, unspecified: Secondary | ICD-10-CM | POA: Diagnosis not present

## 2021-01-17 DIAGNOSIS — E039 Hypothyroidism, unspecified: Secondary | ICD-10-CM | POA: Diagnosis not present

## 2021-01-17 DIAGNOSIS — F419 Anxiety disorder, unspecified: Secondary | ICD-10-CM | POA: Diagnosis not present

## 2021-01-17 DIAGNOSIS — F0390 Unspecified dementia without behavioral disturbance: Secondary | ICD-10-CM | POA: Diagnosis not present

## 2021-01-17 DIAGNOSIS — K219 Gastro-esophageal reflux disease without esophagitis: Secondary | ICD-10-CM | POA: Diagnosis not present

## 2021-01-17 DIAGNOSIS — I10 Essential (primary) hypertension: Secondary | ICD-10-CM | POA: Diagnosis not present

## 2021-01-17 DIAGNOSIS — E782 Mixed hyperlipidemia: Secondary | ICD-10-CM | POA: Diagnosis not present

## 2021-01-25 DIAGNOSIS — I10 Essential (primary) hypertension: Secondary | ICD-10-CM | POA: Diagnosis not present

## 2021-01-25 DIAGNOSIS — Z87891 Personal history of nicotine dependence: Secondary | ICD-10-CM | POA: Diagnosis not present

## 2021-01-25 DIAGNOSIS — E063 Autoimmune thyroiditis: Secondary | ICD-10-CM | POA: Diagnosis not present

## 2021-01-25 DIAGNOSIS — Z8673 Personal history of transient ischemic attack (TIA), and cerebral infarction without residual deficits: Secondary | ICD-10-CM | POA: Diagnosis not present

## 2021-01-25 DIAGNOSIS — F419 Anxiety disorder, unspecified: Secondary | ICD-10-CM | POA: Diagnosis not present

## 2021-01-25 DIAGNOSIS — Z7902 Long term (current) use of antithrombotics/antiplatelets: Secondary | ICD-10-CM | POA: Diagnosis not present

## 2021-01-25 DIAGNOSIS — K219 Gastro-esophageal reflux disease without esophagitis: Secondary | ICD-10-CM | POA: Diagnosis not present

## 2021-01-25 DIAGNOSIS — F0390 Unspecified dementia without behavioral disturbance: Secondary | ICD-10-CM | POA: Diagnosis not present

## 2021-01-25 DIAGNOSIS — E7800 Pure hypercholesterolemia, unspecified: Secondary | ICD-10-CM | POA: Diagnosis not present

## 2021-01-30 DIAGNOSIS — F0390 Unspecified dementia without behavioral disturbance: Secondary | ICD-10-CM | POA: Diagnosis not present

## 2021-01-30 DIAGNOSIS — K219 Gastro-esophageal reflux disease without esophagitis: Secondary | ICD-10-CM | POA: Diagnosis not present

## 2021-01-30 DIAGNOSIS — F419 Anxiety disorder, unspecified: Secondary | ICD-10-CM | POA: Diagnosis not present

## 2021-01-30 DIAGNOSIS — Z8673 Personal history of transient ischemic attack (TIA), and cerebral infarction without residual deficits: Secondary | ICD-10-CM | POA: Diagnosis not present

## 2021-01-30 DIAGNOSIS — E063 Autoimmune thyroiditis: Secondary | ICD-10-CM | POA: Diagnosis not present

## 2021-01-30 DIAGNOSIS — Z87891 Personal history of nicotine dependence: Secondary | ICD-10-CM | POA: Diagnosis not present

## 2021-01-30 DIAGNOSIS — E7800 Pure hypercholesterolemia, unspecified: Secondary | ICD-10-CM | POA: Diagnosis not present

## 2021-01-30 DIAGNOSIS — I10 Essential (primary) hypertension: Secondary | ICD-10-CM | POA: Diagnosis not present

## 2021-01-30 DIAGNOSIS — Z7902 Long term (current) use of antithrombotics/antiplatelets: Secondary | ICD-10-CM | POA: Diagnosis not present

## 2021-02-06 ENCOUNTER — Encounter: Attending: Critical Care Medicine | Primary: Family Medicine

## 2021-02-09 DIAGNOSIS — F419 Anxiety disorder, unspecified: Secondary | ICD-10-CM | POA: Diagnosis not present

## 2021-02-09 DIAGNOSIS — K219 Gastro-esophageal reflux disease without esophagitis: Secondary | ICD-10-CM | POA: Diagnosis not present

## 2021-02-09 DIAGNOSIS — E7800 Pure hypercholesterolemia, unspecified: Secondary | ICD-10-CM | POA: Diagnosis not present

## 2021-02-09 DIAGNOSIS — Z87891 Personal history of nicotine dependence: Secondary | ICD-10-CM | POA: Diagnosis not present

## 2021-02-09 DIAGNOSIS — I10 Essential (primary) hypertension: Secondary | ICD-10-CM | POA: Diagnosis not present

## 2021-02-09 DIAGNOSIS — Z8673 Personal history of transient ischemic attack (TIA), and cerebral infarction without residual deficits: Secondary | ICD-10-CM | POA: Diagnosis not present

## 2021-02-09 DIAGNOSIS — E063 Autoimmune thyroiditis: Secondary | ICD-10-CM | POA: Diagnosis not present

## 2021-02-09 DIAGNOSIS — Z7902 Long term (current) use of antithrombotics/antiplatelets: Secondary | ICD-10-CM | POA: Diagnosis not present

## 2021-02-09 DIAGNOSIS — F0390 Unspecified dementia without behavioral disturbance: Secondary | ICD-10-CM | POA: Diagnosis not present

## 2021-02-12 DIAGNOSIS — I10 Essential (primary) hypertension: Secondary | ICD-10-CM | POA: Diagnosis not present

## 2021-02-12 DIAGNOSIS — F330 Major depressive disorder, recurrent, mild: Secondary | ICD-10-CM | POA: Diagnosis not present

## 2021-02-12 DIAGNOSIS — I69311 Memory deficit following cerebral infarction: Secondary | ICD-10-CM | POA: Diagnosis not present

## 2021-02-12 DIAGNOSIS — Z7902 Long term (current) use of antithrombotics/antiplatelets: Secondary | ICD-10-CM | POA: Diagnosis not present

## 2021-02-12 DIAGNOSIS — E039 Hypothyroidism, unspecified: Secondary | ICD-10-CM | POA: Diagnosis not present

## 2021-02-12 DIAGNOSIS — E782 Mixed hyperlipidemia: Secondary | ICD-10-CM | POA: Diagnosis not present

## 2021-02-12 DIAGNOSIS — K219 Gastro-esophageal reflux disease without esophagitis: Secondary | ICD-10-CM | POA: Diagnosis not present

## 2021-02-12 DIAGNOSIS — E559 Vitamin D deficiency, unspecified: Secondary | ICD-10-CM | POA: Diagnosis not present

## 2021-02-13 ENCOUNTER — Ambulatory Visit: Admit: 2021-02-13 | Discharge: 2021-02-13 | Payer: MEDICARE | Attending: Nurse Practitioner | Primary: Family Medicine

## 2021-02-13 DIAGNOSIS — J8410 Pulmonary fibrosis, unspecified: Secondary | ICD-10-CM

## 2021-02-13 MED ORDER — NINTEDANIB ESYLATE 150 MG PO CAPS
150 | ORAL_CAPSULE | Freq: Two times a day (BID) | ORAL | 11 refills | Status: DC
Start: 2021-02-13 — End: 2021-10-10

## 2021-02-13 MED ORDER — LOPERAMIDE HCL 2 MG PO CAPS
2 MG | ORAL_CAPSULE | Freq: Four times a day (QID) | ORAL | 0 refills | Status: AC | PRN
Start: 2021-02-13 — End: 2021-10-10

## 2021-02-13 NOTE — Progress Notes (Signed)
Subjective:      Patient ID: Shelly [name] is a 75 y.o. female.     HPI  Patient is here for chronic cough.  Previously saw Dr. Hadassah Pais in November 2021.     Patient had a high resolution CT chest done which showed characteristics of pulmonary fibrosis.  Discussed treatment with Ofev and pirfenidone  Order submitted for Ofev today.  Patient advised that common side effect is diarrhea and prescription for Imodium was also submitted.    Patient had a hospitalization and at discharge had a home O2 evaluation done which noted that at rest on room air her SPO2 was 92 percent.  However with ambulation her SPO2 did drop down to 87% qualifying her for supplemental oxygen at 2 L/min nasal cannula.  Patient had orders for portable O2 tank however has difficulty with mobility with portable O2 tanks.  Has a family friend that was able to loan her a portable O2 concentrator.  New order submitted today to see if we can get patient in her own portable O2 concentrator she will need to return the borrowed one.       Medications:   No pulmonary medications  Oxygen 2 2LPM    PRIOR WORKUP:  PFT:  6-minute walk test 12/02/2020: SPO2 on room air at rest was 92%.  SPO2 on room air with ambulation dropped down to 87% qualifying her for supplemental oxygen.  Patient was placed on 2 L nasal cannula.  PFT 09/19/2020: FVC 2.60 (91% of predicted, postbronchodilator 2.61), FEV1 2.40 (110% of predicted, postbronchodilator 2.45), FEV1/FVC ratio 92.36 (119% of predicted postbronchodilator 94), FEF 25-75 4.54 (260% of predicted, postbronchodilator 4.42), ERV 0.60 (60% of predicted), TLC 3.76 (70% of predicted), RV 1.15 (47% of predicted), airway resistance 1.24 (66% of predicted), DLCO 11.51 (58% of predicted),: Final impression: Extrapulmonic restrictive lung disease.    CT Imaging:  High resolution CT chest 10/18/2020: Multiple small mediastinal lymph nodes are present measuring up to 1.5 cm in short axis.  Honeycombing with associated with  subpleural reticular opacities and mild groundglass is noted with basilar predominance.  Associated bronchiectasis is noted in the lower lobes, inferior lingula and right middle lobe.  Involvement of the upper lobes is noted to a lesser degree.  No lung nodules.  No pleural effusion or pneumothorax.  Final impression pulmonary fibrosis with honeycombing and traction bronchiectasis with basilar predominance typical findings associated with UIP/IPF.    Sleep Study:    Laboratory Evaluation:  Hypersensitivity pneumonitis profile 10/11/2020: Was overall negative.  Rheumatoid 10/11/2020:<10  ANCA myeloperoxidase 10/11/2020: <0.3  ANCA proteinase 3 10/11/2020: <0.7  ENA antibody screen 10/11/2020: 0.4  Anti dsDNA 10/11/2020: 2.7    Immunizations:   Immunization History   Administered Date(s) Administered   ??? COVID-19, Pfizer Purple top, DILUTE for use, 12+ yrs, 22mcg/0.3mL dose 12/23/2019, 01/13/2020, 11/02/2020        No flowsheet data found.    BP 124/62 (Site: Left Upper Arm, Position: Sitting, Cuff Size: Large Adult)    Pulse 88    Ht 5\' 6"  (1.676 m)    Wt 181 lb 6.4 oz (82.3 kg)    SpO2 95%    BMI 29.28 kg/m??     Past Medical History:   Diagnosis Date   ??? Skin cancer     basal cell-rt side upper lip     Past Surgical History:   Procedure Laterality Date   ??? KNEE ARTHROPLASTY Left    ??? SHOULDER SURGERY Right    ???  TONSILLECTOMY     ??? TUBAL LIGATION       No family history on file.    Social History     Socioeconomic History   ??? Marital status: Married     Spouse name: Not on file   ??? Number of children: Not on file   ??? Years of education: Not on file   ??? Highest education level: Not on file   Occupational History   ??? Not on file   Tobacco Use   ??? Smoking status: Never Smoker   ??? Smokeless tobacco: Never Used   Substance and Sexual Activity   ??? Alcohol use: Not on file   ??? Drug use: Not on file   ??? Sexual activity: Not on file   Other Topics Concern   ??? Not on file   Social History Narrative   ??? Not on file     Social  Determinants of Health     Financial Resource Strain:    ??? Difficulty of Paying Living Expenses: Not on file   Food Insecurity:    ??? Worried About Running Out of Food in the Last Year: Not on file   ??? Ran Out of Food in the Last Year: Not on file   Transportation Needs:    ??? Lack of Transportation (Medical): Not on file   ??? Lack of Transportation (Non-Medical): Not on file   Physical Activity:    ??? Days of Exercise per Week: Not on file   ??? Minutes of Exercise per Session: Not on file   Stress:    ??? Feeling of Stress : Not on file   Social Connections:    ??? Frequency of Communication with Friends and Family: Not on file   ??? Frequency of Social Gatherings with Friends and Family: Not on file   ??? Attends Religious Services: Not on file   ??? Active Member of Clubs or Organizations: Not on file   ??? Attends BankerClub or Organization Meetings: Not on file   ??? Marital Status: Not on file   Intimate Partner Violence:    ??? Fear of Current or Ex-Partner: Not on file   ??? Emotionally Abused: Not on file   ??? Physically Abused: Not on file   ??? Sexually Abused: Not on file   Housing Stability:    ??? Unable to Pay for Housing in the Last Year: Not on file   ??? Number of Places Lived in the Last Year: Not on file   ??? Unstable Housing in the Last Year: Not on file       Review of Systems   Constitutional:        Decreased activity tolerance secondary to exertional dyspnea.   HENT: Negative.    Eyes: Negative.    Respiratory:        Exertional dyspnea on 2 L nasal cannula supplemental oxygen.  Chronic cough has slightly improved.  Nonproductive.   Cardiovascular: Negative.    Gastrointestinal: Negative.    Endocrine: Negative.    Genitourinary: Negative.    Musculoskeletal: Negative.    Skin: Negative.    Allergic/Immunologic: Negative.    Neurological: Negative.    Hematological: Negative.    Psychiatric/Behavioral: Negative.        Objective:       Physical Exam  General appearance - alert, well appearing, and in no distress, oriented to  person, place, and time and normal appearing weight  Mental status - alert, oriented to person, place, and  time, normal mood, behavior, speech, dress, motor activity, and thought processes  Eyes - pupils equal and reactive, extraocular eye movements intact  Ears - not examined  Nose - normal and patent, no erythema, discharge or polyps  Mouth - mucous membranes moist, pharynx normal without lesions  Neck - supple, no significant adenopathy  Chest -lung sounds diminished throughout.  Fine bibasilar rales noted.  No wheezing or rhonchi.  Heart -normal rate, regular rhythm, normal S1, S2, no murmurs, rubs, clicks or gallops  Abdomen - soft, nontender, nondistended, no masses or organomegaly  Neuro- alert, oriented, normal speech, no focal findings or movement disorder noted}  Extremities - peripheral pulses normal, no pedal edema, no clubbing or cyanosis  Skin - normal coloration and turgor, no rashes, no suspicious skin lesions noted     Wt Readings from Last 3 Encounters:   02/13/21 181 lb 6.4 oz (82.3 kg)   10/11/20 193 lb 6.4 oz (87.7 kg)       Results for orders placed or performed during the hospital encounter of 10/11/20   ANA Screen with Reflex   Result Value Ref Range    ANA NEGATIVE NEGATIVE    ENA Antibodies Screen 0.4 <0.7 U/mL    Anti ds DNA 2.7 <10.0 IU/mL   Anti-Neutrophilic Cytoplasmic Antibody   Result Value Ref Range    ANCA Myeloperoxidase <0.3 0.0 - 3.5 AU/mL    ANCA Proteinase 3 <0.7 0.0 - 2.0 AU/mL   Rheumatoid Factor   Result Value Ref Range    Rheumatoid Factor <10 <14 IU/mL   Hypersensitivity pnuemonitis profile   Result Value Ref Range    A fumigatus #1 None Detected None Detected    A fumigatus #6 None Detected None Detected    A. Pullulans Abs None Detected None Detected    Pigeon Serum None Detected None Detected    M faeni None Detected None Detected    T Vulgaris #1 See Note None Detected    A Flavus None Detected None Detected    A fumigatus #2 None Detected None Detected    A fumigatus  #3 None Detected None Detected    S viridis None Detected None Detected    T candidus None Detected None Detected    Phoma betae <0.10 <=0.34 kU/L    Beef <0.10 <=0.34 kU/L    Allergen, Pork <0.10 <=0.34 kU/L    Feather mix Negative Negative kU/L    Immunocap score See Note        No results found.    Assessment:      1. Pulmonary fibrosis (HCC)    2. Hypoxemic respiratory failure, chronic (HCC)    3. Chronic cough          Plan:      1. Medications reviewed added Ofev.  We will start the authorization process with her insurance.  Discussed side effects of medication including diarrhea with management with Imodium.  2. Educated and clarified the medication use.  3. Recommend flu vaccination in the fall annually.  4. Patient is up-to-date with pneumococcal vaccinations from pulmonary perspective.  5. Patient has  received Covid vaccination and booster.  Discussed strategies to protect against Covid 19.   6. Maintain an active lifestyle.  7. Supplemental oxygen ordered at 2 L/min nasal cannula.  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  8. Patient's questions were answered.  9. Pulmonary function tests were reviewed   10. CT scan of the chest was reviewed  11. We'll see the patient back in 4 months with Dr. Hadassah Pais          Electronically signed by Christin Bach, APRN - CNP on 02/13/2021 at 1:01 PM

## 2021-02-26 DIAGNOSIS — F419 Anxiety disorder, unspecified: Secondary | ICD-10-CM | POA: Diagnosis not present

## 2021-02-26 DIAGNOSIS — K219 Gastro-esophageal reflux disease without esophagitis: Secondary | ICD-10-CM | POA: Diagnosis not present

## 2021-02-26 DIAGNOSIS — I10 Essential (primary) hypertension: Secondary | ICD-10-CM | POA: Diagnosis not present

## 2021-02-26 DIAGNOSIS — Z8673 Personal history of transient ischemic attack (TIA), and cerebral infarction without residual deficits: Secondary | ICD-10-CM | POA: Diagnosis not present

## 2021-02-26 DIAGNOSIS — E063 Autoimmune thyroiditis: Secondary | ICD-10-CM | POA: Diagnosis not present

## 2021-02-26 DIAGNOSIS — Z7902 Long term (current) use of antithrombotics/antiplatelets: Secondary | ICD-10-CM | POA: Diagnosis not present

## 2021-02-26 DIAGNOSIS — Z87891 Personal history of nicotine dependence: Secondary | ICD-10-CM | POA: Diagnosis not present

## 2021-02-26 DIAGNOSIS — E7800 Pure hypercholesterolemia, unspecified: Secondary | ICD-10-CM | POA: Diagnosis not present

## 2021-02-26 DIAGNOSIS — F0390 Unspecified dementia without behavioral disturbance: Secondary | ICD-10-CM | POA: Diagnosis not present

## 2021-03-12 DIAGNOSIS — K219 Gastro-esophageal reflux disease without esophagitis: Secondary | ICD-10-CM | POA: Diagnosis not present

## 2021-03-12 DIAGNOSIS — E559 Vitamin D deficiency, unspecified: Secondary | ICD-10-CM | POA: Diagnosis not present

## 2021-03-12 DIAGNOSIS — Z7902 Long term (current) use of antithrombotics/antiplatelets: Secondary | ICD-10-CM | POA: Diagnosis not present

## 2021-03-12 DIAGNOSIS — I6931 Attention and concentration deficit following cerebral infarction: Secondary | ICD-10-CM | POA: Diagnosis not present

## 2021-03-12 DIAGNOSIS — E039 Hypothyroidism, unspecified: Secondary | ICD-10-CM | POA: Diagnosis not present

## 2021-03-12 DIAGNOSIS — E782 Mixed hyperlipidemia: Secondary | ICD-10-CM | POA: Diagnosis not present

## 2021-03-12 DIAGNOSIS — F330 Major depressive disorder, recurrent, mild: Secondary | ICD-10-CM | POA: Diagnosis not present

## 2021-03-12 DIAGNOSIS — I10 Essential (primary) hypertension: Secondary | ICD-10-CM | POA: Diagnosis not present

## 2021-04-16 DIAGNOSIS — K219 Gastro-esophageal reflux disease without esophagitis: Secondary | ICD-10-CM | POA: Diagnosis not present

## 2021-04-16 DIAGNOSIS — I69311 Memory deficit following cerebral infarction: Secondary | ICD-10-CM | POA: Diagnosis not present

## 2021-04-16 DIAGNOSIS — Z7902 Long term (current) use of antithrombotics/antiplatelets: Secondary | ICD-10-CM | POA: Diagnosis not present

## 2021-04-18 DIAGNOSIS — I10 Essential (primary) hypertension: Secondary | ICD-10-CM | POA: Diagnosis not present

## 2021-04-18 DIAGNOSIS — K219 Gastro-esophageal reflux disease without esophagitis: Secondary | ICD-10-CM | POA: Diagnosis not present

## 2021-05-28 DIAGNOSIS — U071 COVID-19: Secondary | ICD-10-CM | POA: Diagnosis not present

## 2021-06-04 DIAGNOSIS — I6931 Attention and concentration deficit following cerebral infarction: Secondary | ICD-10-CM | POA: Diagnosis not present

## 2021-06-04 DIAGNOSIS — Z7902 Long term (current) use of antithrombotics/antiplatelets: Secondary | ICD-10-CM | POA: Diagnosis not present

## 2021-06-20 ENCOUNTER — Ambulatory Visit: Payer: MEDICARE | Attending: Critical Care Medicine | Primary: Family Medicine

## 2021-08-13 DIAGNOSIS — I10 Essential (primary) hypertension: Secondary | ICD-10-CM | POA: Diagnosis not present

## 2021-08-13 DIAGNOSIS — Z7902 Long term (current) use of antithrombotics/antiplatelets: Secondary | ICD-10-CM | POA: Diagnosis not present

## 2021-08-13 DIAGNOSIS — E039 Hypothyroidism, unspecified: Secondary | ICD-10-CM | POA: Diagnosis not present

## 2021-08-13 DIAGNOSIS — I69319 Unspecified symptoms and signs involving cognitive functions following cerebral infarction: Secondary | ICD-10-CM | POA: Diagnosis not present

## 2021-08-13 DIAGNOSIS — K219 Gastro-esophageal reflux disease without esophagitis: Secondary | ICD-10-CM | POA: Diagnosis not present

## 2021-08-22 DIAGNOSIS — I6930 Unspecified sequelae of cerebral infarction: Secondary | ICD-10-CM | POA: Diagnosis not present

## 2021-08-22 DIAGNOSIS — H35033 Hypertensive retinopathy, bilateral: Secondary | ICD-10-CM | POA: Diagnosis not present

## 2021-08-22 DIAGNOSIS — H02831 Dermatochalasis of right upper eyelid: Secondary | ICD-10-CM | POA: Diagnosis not present

## 2021-08-22 DIAGNOSIS — H0288B Meibomian gland dysfunction left eye, upper and lower eyelids: Secondary | ICD-10-CM | POA: Diagnosis not present

## 2021-08-22 DIAGNOSIS — H5213 Myopia, bilateral: Secondary | ICD-10-CM | POA: Diagnosis not present

## 2021-08-22 DIAGNOSIS — H0288A Meibomian gland dysfunction right eye, upper and lower eyelids: Secondary | ICD-10-CM | POA: Diagnosis not present

## 2021-08-22 DIAGNOSIS — H25813 Combined forms of age-related cataract, bilateral: Secondary | ICD-10-CM | POA: Diagnosis not present

## 2021-08-22 DIAGNOSIS — H43393 Other vitreous opacities, bilateral: Secondary | ICD-10-CM | POA: Diagnosis not present

## 2021-08-22 DIAGNOSIS — H02834 Dermatochalasis of left upper eyelid: Secondary | ICD-10-CM | POA: Diagnosis not present

## 2021-08-29 DIAGNOSIS — E559 Vitamin D deficiency, unspecified: Secondary | ICD-10-CM | POA: Diagnosis not present

## 2021-09-06 DIAGNOSIS — Z1231 Encounter for screening mammogram for malignant neoplasm of breast: Secondary | ICD-10-CM | POA: Diagnosis not present

## 2021-09-11 DIAGNOSIS — I6930 Unspecified sequelae of cerebral infarction: Secondary | ICD-10-CM | POA: Diagnosis not present

## 2021-09-19 NOTE — Telephone Encounter (Signed)
Formatting of this note is different from the original.  LAST ENCOUNTER:  This Department and Provider:  05/21/2021 Jerald Kief, MD    NEXT ENCOUNTER:   Future Appointments   Date Time Provider Department Center   11/21/2021  2:20 PM Jerald Kief, MD [id number]     NEXT ENCOUNTER THIS DEPARTMENT: 11/21/2021    REQUESTED REFILL:   Last Refilled: 06/14/21    Requested Prescriptions     Pending Prescriptions Disp Refills    simvastatin (ZOCOR) 20 MG tablet [Pharmacy Med Name: SIMVASTATIN 20MG ] 90 tablet 0     Sig: TAKE 1 TABLET BY MOUTH AT BEDTIME    hydroCHLOROthiazide (HYDRODIURIL) 25 MG tablet [Pharmacy Med Name: HYDROCHLOROT 25MG ] 90 tablet 0     Sig: TAKE 1 TABLET BY MOUTH EVERY DAY    amLODIPine (NORVASC) 10 MG tablet [Pharmacy Med Name: AMLODIPINE 10MG ] 90 tablet 0     Sig: TAKE 1 TABLET BY MOUTH DAILY.     MEDICATIONS:   Outpatient Encounter Medications as of 09/19/2021   Medication Sig Dispense Refill    acetaminophen (TYLENOL) 500 MG tablet Take 500 mg by mouth every 6 (six) hours as needed.      albuterol (PROVENTIL HFA;VENTOLIN HFA) 90 mcg/actuation inhaler Inhale 2 puffs into the lungs every 4 (four) hours as needed. 18 g 0    amLODIPine (NORVASC) 10 MG tablet TAKE 1 TABLET BY MOUTH DAILY. 90 tablet 0    aspirin 81 MG EC tablet Take 1 tablet (81 mg total) by mouth daily.      cholecalciferol, vitamin D3, (VITAMIN D3 ORAL) Take 1 tablet by mouth daily.      DOCOSAHEXANOIC ACID/EPA (FISH OIL ORAL) daily.       hydroCHLOROthiazide (HYDRODIURIL) 25 MG tablet TAKE 1 TABLET BY MOUTH EVERY DAY 90 tablet 1    Oxygen Inhale into the lungs Continuous. 1 Canister 0    potassium chloride SA (KLOR-CON) 20 MEQ ER tablet Take 1 tablet (20 mEq total) by mouth daily. 90 tablet 1    simvastatin (ZOCOR) 20 MG tablet TAKE 1 TABLET BY MOUTH AT BEDTIME 90 tablet 1    VITAMIN E, DL,TOCOPHERYL ACET, (VITAMIN E, DL, ACETATE, ORAL) daily.       [DISCONTINUED] atenoloL (TENORMIN) 50 MG tablet TAKE 1 TABLET BY MOUTH  EVERY DAY (Patient taking differently: 25 mg.   ) 90 tablet 1     No facility-administered encounter medications on file as of 09/19/2021.     Electronically signed by , LPN at 09/21/2021 10:50 AM EDT

## 2021-10-10 ENCOUNTER — Ambulatory Visit
Admit: 2021-10-10 | Discharge: 2021-10-10 | Payer: MEDICARE | Attending: Critical Care Medicine | Primary: Family Medicine

## 2021-10-10 DIAGNOSIS — J84112 Idiopathic pulmonary fibrosis: Secondary | ICD-10-CM

## 2021-10-10 MED ORDER — AZITHROMYCIN 250 MG PO TABS
250 MG | ORAL_TABLET | ORAL | 0 refills | Status: AC
Start: 2021-10-10 — End: 2022-06-27

## 2021-10-10 MED ORDER — PREDNISONE 10 MG PO TABS
10 MG | ORAL_TABLET | Freq: Every day | ORAL | 0 refills | Status: AC
Start: 2021-10-10 — End: 2021-10-15

## 2021-10-10 NOTE — Progress Notes (Signed)
REASON FOR THE CONSULTATION:  IPF   HISTORY OF PRESENT ILLNESS:    Shelly [name] is a 75 y.o. year old female   Progressive dyspnea grade 3 . No wheeze , cough with sputum no hemoptysis no wheeze   Did not use OFEV due to side effect of diarrhea .   She likes to be active and social and diarrhea will limit that .     Last visit   here for evaluation of cough which has been present since before February 2021.  Patient correlates it with start of a bronchitis.  She took antibiotics and prednisone without any improvement.  Cough is barky.  She has tried inhaler albuterol without any significant improvement did not have long-acting beta agonist or ICS.  Also albuterol causes mouth sores she is does not like to use it.  Cough is present throughout the day but worse when she lays down at night does not complain of GERD.  She has sputum in the morning which is frothy.  Denies wheezing no hemoptysis no history of asthma.  Has been using cough syrup cough drops without any improvement.  Does have dyspnea with exertion.  Her mother may have had lung disease.  Patient does not have birds or lived on a farm from [phone].          PAST MEDICAL HISTORY:       Diagnosis Date    Skin cancer     basal cell-rt side upper lip         Family History:   History reviewed. No pertinent family history.    SURGICAL HISTORY:   Past Surgical History:   Procedure Laterality Date    KNEE ARTHROPLASTY Left     SHOULDER SURGERY Right     TONSILLECTOMY      TUBAL LIGATION             SOCIAL AND OCCUPATIONAL HEALTH:      There  No history of TB or TB exposure.    There  No asbestos ,[place]ilica dust exposure.    The patient reports  No coal mine, [place], [place], [employer] exposure.   History of travel outside the country No  There  is use of   marijuana No   VapingNo  IV heroinNo  Crack cocaineNo  There  Nohot tub exposure.   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 She has never used smokeless tobacco.  ETOH:   has no history on file for alcohol use.      ALLERGIES:      Allergies   Allergen Reactions    Atorvastatin Anaphylaxis    Adhesive Tape Other (See Comments)    Cephalexin Other (See Comments)    Nitrofurantoin Diarrhea and Nausea And Vomiting     intolerance         Home Meds:   Prior to Admission medications    Medication Sig Start Date End Date Taking? Authorizing Provider   predniSONE (DELTASONE) 10 MG tablet Take 4 tablets by mouth daily for 5 days 10/10/21 10/15/21 Yes Benetta Spar, MD   azithromycin (ZITHROMAX Z-PAK) 250 MG tablet Azithromycin 250  mg po two tabs  first day Then 250 mg po daily one tab for next 4 days 10/10/21  Yes Benetta Spar, MD   Cranberry 1000 MG CAPS  Take by mouth   Yes Historical Provider, MD   Omega-3 Fatty Acids (FISH OIL) 1000 MG CAPS Take 3,000 mg by mouth 3 times daily   Yes Historical Provider, MD   amLODIPine (NORVASC) 5 MG tablet Take 10 mg by mouth daily  09/25/20 10/10/21 Yes Historical Provider, MD   aspirin 81 MG EC tablet daily   Yes Historical Provider, MD   hydroCHLOROthiazide (HYDRODIURIL) 25 MG tablet Take 25 mg by mouth daily 11/12/19  Yes Historical Provider, MD   simvastatin (ZOCOR) 20 MG tablet Take 20 mg by mouth nightly 05/15/20 10/10/21 Yes Historical Provider, MD   vitamin E 1000 units capsule Take 1,000 Units by mouth daily   Yes Historical Provider, MD   vitamin D 25 MCG (1000 UT) CAPS Take by mouth   Yes Historical Provider, MD            Review of Systems -  General ROS: negative for - chills, fatigue, fever or weight loss  ENT ROS: negative for - headaches, oral lesions or sore throat  Cardiovascular ROS: no chest pain , orthopnea or pnd   Gastrointestinal ROS: no abdominal pain, change in bowel habits, or black or bloody stools  Skin - no rash   Neuro - no blurry vision , no loc . No focal weakness   msk - no jt tenderness or swelling    Vascular - no claudication , rest  completed and negative   Lymphatic - complete and negative   Hematology - oncology - complete and negative   Allergy immunology - complete and negative   GU no burning or hematuria    Vitals:  BP 136/76    Pulse 78    Temp 97.9 ??F (36.6 ??C)    Ht 5\' 6"  (1.676 m)    SpO2 95%    BMI 29.28 kg/m??     PHYSICAL EXAM:  Head and neck atraumatic, normocephalic    Lymph nodes-no cervical, supraclavicular lymphadenopathy    Neck-no JVP elevation    Lungs - Ventilating all lobes with velcro  rales, no rhonchi, wheezes or dullness.  No bronchial breath sounds.  Chest expansion equal bilaterally    CVS- S1, S2 regular.  No S3 no S4, no murmurs    Abdomen-nontender, nondistended.  Bowel sounds are present.  No organomegaly    Lower extremity-no edema    Upper extremity-no edema    Neurological-grossly normal cranial nerves.  No overt motor deficit               IMPRESSION:     Diagnosis Orders   1. IPF (idiopathic pulmonary fibrosis) (HCC)  predniSONE (DELTASONE) 10 MG tablet    azithromycin (ZITHROMAX Z-PAK) 250 MG tablet    External Referral To Pulmonary Rehab    Full PFT Study With Bronchodilator      2. Hypoxemic respiratory failure, chronic (HCC)        3. Chronic cough             :                PLAN:      HRCT chest UIP /IPF - doesnot want OFEV due to side effect of diarrhea . Information for Pirfenidone proided but at present doesnot want to use either .   Keep prednisone and zpack for acute bronchitis   Pulmonary rehab   PFT in 6 months   Recommend yearly Flue vaccine   Stay upto date with pneumonia vaccine and covid -  19   Continue oxygen . Patient Lochlyn Zullo Paolella needs portable oxygen concentrator to avoid adverse medical outcome .   Follow up 6 months     Imaging reviewed with PT and her family       Requested Prescriptions     Signed Prescriptions Disp Refills    predniSONE (DELTASONE) 10 MG tablet 20 tablet 0     Sig: Take 4 tablets by mouth daily for 5 days    azithromycin (ZITHROMAX Z-PAK) 250 MG tablet 6 tablet 0      Sig: Azithromycin 250  mg po two tabs  first day Then 250 mg po daily one tab for next 4 days       Medications Discontinued During This Encounter   Medication Reason    Nintedanib Esylate 150 MG CAPS LIST CLEANUP    loperamide (RA ANTI-DIARRHEAL) 2 MG capsule Patient Choice       Marylu Lund received counseling on the following healthy behaviors: nutrition, exercise and medication adherence    Patient given educational materials : see patient instruction       Discussed use, benefit, and side effects of prescribed medications.  Barriers to medication compliance addressed.      All patient questions answered.  Pt voiced understanding.   I hope this updates you on my evaluation and clinical thinking. Thank you for allowing me to participate in his care.     Sincerely,    Electronically signed by Benetta Spar, MD on 10/10/2021 at 9:41 AM       Please note that this chart was generated using voice recognition Dragon dictation software.  Although every effort was made to ensure the accuracy of this automated transcription, some errors in transcription may have occurred.

## 2021-11-12 DIAGNOSIS — I69311 Memory deficit following cerebral infarction: Secondary | ICD-10-CM | POA: Diagnosis not present

## 2021-11-12 DIAGNOSIS — I10 Essential (primary) hypertension: Secondary | ICD-10-CM | POA: Diagnosis not present

## 2021-11-12 DIAGNOSIS — Z7902 Long term (current) use of antithrombotics/antiplatelets: Secondary | ICD-10-CM | POA: Diagnosis not present

## 2021-11-12 DIAGNOSIS — F330 Major depressive disorder, recurrent, mild: Secondary | ICD-10-CM | POA: Diagnosis not present

## 2022-02-27 DIAGNOSIS — D696 Thrombocytopenia, unspecified: Secondary | ICD-10-CM | POA: Diagnosis not present

## 2022-04-03 DIAGNOSIS — E559 Vitamin D deficiency, unspecified: Secondary | ICD-10-CM | POA: Diagnosis not present

## 2022-04-24 ENCOUNTER — Inpatient Hospital Stay: Admit: 2022-04-24 | Payer: MEDICARE | Primary: Family Medicine

## 2022-04-24 ENCOUNTER — Ambulatory Visit: Payer: MEDICARE | Attending: Critical Care Medicine | Primary: Family Medicine

## 2022-04-24 DIAGNOSIS — J84112 Idiopathic pulmonary fibrosis: Secondary | ICD-10-CM

## 2022-04-24 MED ORDER — ALBUTEROL SULFATE HFA 108 (90 BASE) MCG/ACT IN AERS
108 (90 Base) MCG/ACT | Freq: Once | RESPIRATORY_TRACT | Status: AC
Start: 2022-04-24 — End: 2022-04-24
  Administered 2022-04-24: 13:00:00 4 via RESPIRATORY_TRACT

## 2022-04-24 MED FILL — VENTOLIN HFA 108 (90 BASE) MCG/ACT IN AERS: 108 (90 Base) MCG/ACT | RESPIRATORY_TRACT | Qty: 18

## 2022-04-24 NOTE — Procedures (Signed)
[facility]                 [address] [place], [place] [address]                               PULMONARY FUNCTION    PATIENT NAME: Shelly [name], Shelly [name]                     DOB:        [birth year]  MED REC NO:   [id number]                             ROOM:  ACCOUNT NO:   [id number]                           ADMIT DATE: 04/24/2022  PROVIDER:     Benetta Spar    DATE OF PROCEDURE:  04/24/2022    Spirometry shows a restrictive flow volume loop.  FEV1 of 99% predicted  with 4% change to 104% predicted.  FVC 77% predicted with 5% change to  81% predicted.  FEV1/FVC ratio 96, postbronchodilator 95.    Total lung capacity 59% predicted, RV 34% predicted by body box.   Uncorrected diffusion capacity 32% predicted, corrected for alveolar  volume 45% predicted with normal airway resistance.    FINAL IMPRESSION:  Intrapulmonic restrictive lung disease with severe  reduction in lung volumes and diffusion capacity.  Clinical correlation  advised.        Kylea Berrong    D: 05/02/2022 0:52:56       T: 05/02/2022 5:31:01     [id number]  Job#: [id number]     Doc#: [id number]    CC:

## 2022-04-24 NOTE — Procedures (Signed)
[facility]                 [address] [place], [place] [address]                               PULMONARY FUNCTION    PATIENT NAME: [name], Shelly E                     DOB:        [birth year]  MED REC NO:   [id number]                             ROOM:  ACCOUNT NO:   [id number]                           ADMIT DATE: 04/24/2022  PROVIDER:     Benetta Spar    DATE OF PROCEDURE:  04/24/2022    Spirometry shows a restrictive flow volume loop.  FEV1  postbronchodilator 104% predicted, FVC is 81% predicted, there was 4%  bronchodilator change in FEV1 and 5% bronchodilator change in FVC.    FEV1/FVC ratio postbronchodilator is 95.    Total lung capacity by body box 59% predicted, RV 34% predicted, and  diffusion capacity uncorrected 32% predicted, corrected for alveolar  volume 45% predicted.  Airway resistance is normal.    FINAL IMPRESSION:  The study shows intrapulmonic restrictive lung  disease due to pulmonary fibrosis and normal ventilatory function.   Clinical correlation advised.          [name]    D: 04/24/2022 15:40:22       T: 04/24/2022 17:37:46     [id number]  Job#: [id number]     Doc#: [id number]    CC:

## 2022-04-25 LAB — FULL PFT STUDY WITH BRONCHODILATOR

## 2022-05-01 ENCOUNTER — Ambulatory Visit
Admit: 2022-05-01 | Discharge: 2022-05-01 | Payer: MEDICARE | Attending: Critical Care Medicine | Primary: Family Medicine

## 2022-05-01 DIAGNOSIS — J84112 Idiopathic pulmonary fibrosis: Secondary | ICD-10-CM

## 2022-05-01 MED ORDER — ALBUTEROL SULFATE (2.5 MG/3ML) 0.083% IN NEBU
Freq: Four times a day (QID) | RESPIRATORY_TRACT | 11 refills | Status: AC | PRN
Start: 2022-05-01 — End: 2022-05-21

## 2022-05-01 MED ORDER — PREDNISONE 10 MG PO TABS
10 MG | ORAL_TABLET | Freq: Every day | ORAL | 1 refills | Status: DC
Start: 2022-05-01 — End: 2022-06-27

## 2022-05-01 NOTE — Progress Notes (Signed)
REASON FOR THE CONSULTATION:  IPF   HISTORY OF PRESENT ILLNESS:    Shelly BatmanJanet E [name] is a 76 y.o. year old female   Continues to have dyspnea with ex which is slowly progressive , cough is present with sputum , no hemoptysis . Has home o2 but has 40 feet tubing so uses 3 l but does have dec sats . Uses POC with conserving device .       Last visit   here for evaluation of cough which has been present since before February 2021.  Patient correlates it with start of a bronchitis.  She took antibiotics and prednisone without any improvement.  Cough is barky.  She has tried inhaler albuterol without any significant improvement did not have long-acting beta agonist or ICS.  Also albuterol causes mouth sores she is does not like to use it.  Cough is present throughout the day but worse when she lays down at night does not complain of GERD.  She has sputum in the morning which is frothy.  Denies wheezing no hemoptysis no history of asthma.  Has been using cough syrup cough drops without any improvement.  Does have dyspnea with exertion.  Her mother may have had lung disease.  Patient does not have birds or lived on a farm from [phone].          PAST MEDICAL HISTORY:       Diagnosis Date    Skin cancer     basal cell-rt side upper lip         Family History:   No family history on file.    SURGICAL HISTORY:   Past Surgical History:   Procedure Laterality Date    KNEE ARTHROPLASTY Left     SHOULDER SURGERY Right     TONSILLECTOMY      TUBAL LIGATION             SOCIAL AND OCCUPATIONAL HEALTH:      There  No history of TB or TB exposure.    There  No asbestos ,[place]il[place] dust exposure.    The patient reports  No coal mine, [place], [place], [employer] exposure.   History of travel outside the country No  There  is use of   marijuana No   VapingNo  IV heroinNo  Crack cocaineNo  There  Nohot tub exposure.   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 She has never used smokeless tobacco.  ETOH:   has no history on file for alcohol use.      ALLERGIES:      Allergies   Allergen Reactions    Atorvastatin Anaphylaxis    Adhesive Tape Other (See Comments)    Cephalexin Other (See Comments)    Nitrofurantoin Diarrhea and Nausea And Vomiting     intolerance         Home Meds:   Prior to Admission medications    Medication Sig Start Date End Date Taking? Authorizing Provider   NONFORMULARY Wild cherry bark and honey syrup   Yes Historical Provider, MD   predniSONE (DELTASONE) 10 MG tablet Take 4 tablets by mouth daily for 5 days 05/01/22 05/06/22 Yes Benetta SparSalil Ekaterini Capitano, MD   albuterol (PROVENTIL) (2.5 MG/3ML) 0.083% nebulizer solution Take 3 mLs by nebulization every 6 hours as needed for Wheezing 05/01/22  Yes  Benetta Spar, MD   Cranberry 1000 MG CAPS Take by mouth   Yes Historical Provider, MD   Omega-3 Fatty Acids (FISH OIL) 1000 MG CAPS Take 3 capsules by mouth 3 times daily   Yes Historical Provider, MD   amLODIPine (NORVASC) 5 MG tablet Take 2 tablets by mouth daily 09/25/20 05/01/22 Yes Historical Provider, MD   aspirin 81 MG EC tablet daily   Yes Historical Provider, MD   hydroCHLOROthiazide (HYDRODIURIL) 25 MG tablet Take 1 tablet by mouth daily 11/12/19  Yes Historical Provider, MD   simvastatin (ZOCOR) 20 MG tablet Take 1 tablet by mouth nightly 05/15/20 05/01/22 Yes Historical Provider, MD   vitamin E 1000 units capsule Take 1 capsule by mouth daily   Yes Historical Provider, MD   vitamin D 25 MCG (1000 UT) CAPS Take by mouth   Yes Historical Provider, MD   azithromycin (ZITHROMAX Z-PAK) 250 MG tablet Azithromycin 250  mg po two tabs  first day Then 250 mg po daily one tab for next 4 days  Patient not taking: Reported on 05/01/2022 10/10/21   Benetta Spar, MD            Review of Systems -  General ROS: negative for - chills, fatigue, fever or weight loss  ENT ROS: negative for - headaches, oral lesions or sore throat  Cardiovascular ROS: no  chest pain , orthopnea or pnd   Gastrointestinal ROS: no abdominal pain, change in bowel habits, or black or bloody stools  Skin - no rash   Neuro - no blurry vision , no loc . No focal weakness   msk - no jt tenderness or swelling    Vascular - no claudication , rest completed and negative   Lymphatic - complete and negative   Hematology - oncology - complete and negative   Allergy immunology - complete and negative   GU no burning or hematuria    Vitals:  BP 128/74 (Site: Right Upper Arm, Position: Sitting, Cuff Size: Medium Adult)   Pulse 76   Ht 5\' 6"  (1.676 m)   Wt 173 lb 3.2 oz (78.6 kg)   SpO2 91% Comment: with 2L  BMI 27.96 kg/m     PHYSICAL EXAM:  Head and neck atraumatic, normocephalic    Lymph nodes-no cervical, supraclavicular lymphadenopathy    Neck-no JVP elevation    Lungs - Ventilating all lobes without rhonchi, wheezes or dullness.  No bronchial breath sounds.  Chest expansion equal bilaterally, b/l velcro rales .    CVS- S1, S2 regular.  No S3 no S4, no murmurs    Abdomen-nontender, nondistended.  Bowel sounds are present.  No organomegaly    Lower extremity-no edema    Upper extremity-no edema    Neurological-grossly normal cranial nerves.  No overt motor deficit                             IMPRESSION:     Diagnosis Orders   1. IPF (idiopathic pulmonary fibrosis) (HCC)  predniSONE (DELTASONE) 10 MG tablet    DME Order for Nebulizer as OP    albuterol (PROVENTIL) (2.5 MG/3ML) 0.083% nebulizer solution    DME Order for Home Oxygen as OP      2. Hypoxemic respiratory failure, chronic (HCC)  DME Order for Nebulizer as OP    albuterol (PROVENTIL) (2.5 MG/3ML) 0.083% nebulizer solution    DME Order for Home Oxygen as OP  3. Chronic cough  DME Order for Nebulizer as OP    DME Order for Home Oxygen as OP      4. UIP (usual interstitial pneumonitis) (HCC)  DME Order for Nebulizer as OP    albuterol (PROVENTIL) (2.5 MG/3ML) 0.083% nebulizer solution    DME Order for Home Oxygen as OP           :                 PLAN:      PFT - decline in TLC and DL CO - declines OFEV or Pirfenidone .   Continue oxygen but need to shorten tubing from concentrator . Use portable tanks when needed without conserving device .  Keep prednisone and zpack for acute bronchitis   Declines Pulmonary rehab   Recommend yearly Flue vaccine   Stay up to date with pneumonia vaccine and Covid -19   Continue oxygen . It is benefiting pt     PFT reviewed with PT and her family   Follow up 1 year - earlier if needed     Requested Prescriptions     Signed Prescriptions Disp Refills    predniSONE (DELTASONE) 10 MG tablet 20 tablet 1     Sig: Take 4 tablets by mouth daily for 5 days    albuterol (PROVENTIL) (2.5 MG/3ML) 0.083% nebulizer solution 120 each 11     Sig: Take 3 mLs by nebulization every 6 hours as needed for Wheezing       There are no discontinued medications.      Marylu Lund received counseling on the following healthy behaviors: nutrition, exercise and medication adherence    Patient given educational materials : see patient instruction       Discussed use, benefit, and side effects of prescribed medications.  Barriers to medication compliance addressed.      All patient questions answered.  Pt voiced understanding.   I hope this updates you on my evaluation and clinical thinking. Thank you for allowing me to participate in his care.     Sincerely,    Electronically signed by Benetta Spar, MD on 05/01/2022 at 9:51 PM       Please note that this chart was generated using voice recognition Dragon dictation software.  Although every effort was made to ensure the accuracy of this automated transcription, some errors in transcription may have occurred.

## 2022-05-07 NOTE — Telephone Encounter (Signed)
Patient tried to get the smaller oxygen tanks we ordered.  Shelly [name] is needing a pulse ox test  Before they will give them to her.

## 2022-05-07 NOTE — Telephone Encounter (Signed)
Spoke with DME, they are faxing order for conserving device, will get signed and faxed back.

## 2022-05-21 ENCOUNTER — Encounter

## 2022-05-21 MED ORDER — ALBUTEROL SULFATE (2.5 MG/3ML) 0.083% IN NEBU
Freq: Four times a day (QID) | RESPIRATORY_TRACT | 11 refills | Status: AC | PRN
Start: 2022-05-21 — End: ?

## 2022-05-21 NOTE — Telephone Encounter (Signed)
New pharmacy.    Last Appt:  05/01/2022  Next Appt:   Visit date not found  Med verified in Epic

## 2022-06-27 ENCOUNTER — Encounter

## 2022-06-27 MED ORDER — PREDNISONE 10 MG PO TABS
10 MG | ORAL_TABLET | Freq: Every day | ORAL | 0 refills | Status: AC
Start: 2022-06-27 — End: 2022-07-02

## 2022-06-27 MED ORDER — AZITHROMYCIN 250 MG PO TABS
250 MG | ORAL_TABLET | ORAL | 0 refills | Status: AC
Start: 2022-06-27 — End: ?

## 2022-06-27 NOTE — Telephone Encounter (Signed)
Last Appt:  05/01/2022  Next Appt:   12/11/2022  Med verified in Epic       Patient not feeling well. Starting to cough up thick green phlegm.

## 2022-07-01 DIAGNOSIS — H6123 Impacted cerumen, bilateral: Secondary | ICD-10-CM | POA: Diagnosis not present

## 2022-07-01 DIAGNOSIS — Z7902 Long term (current) use of antithrombotics/antiplatelets: Secondary | ICD-10-CM | POA: Diagnosis not present

## 2022-07-01 DIAGNOSIS — I69319 Unspecified symptoms and signs involving cognitive functions following cerebral infarction: Secondary | ICD-10-CM | POA: Diagnosis not present

## 2022-07-01 DIAGNOSIS — H9313 Tinnitus, bilateral: Secondary | ICD-10-CM | POA: Diagnosis not present

## 2022-08-22 DIAGNOSIS — H43393 Other vitreous opacities, bilateral: Secondary | ICD-10-CM | POA: Diagnosis not present

## 2022-08-22 DIAGNOSIS — I6930 Unspecified sequelae of cerebral infarction: Secondary | ICD-10-CM | POA: Diagnosis not present

## 2022-08-22 DIAGNOSIS — H25813 Combined forms of age-related cataract, bilateral: Secondary | ICD-10-CM | POA: Diagnosis not present

## 2022-08-22 DIAGNOSIS — H02834 Dermatochalasis of left upper eyelid: Secondary | ICD-10-CM | POA: Diagnosis not present

## 2022-08-22 DIAGNOSIS — H0288B Meibomian gland dysfunction left eye, upper and lower eyelids: Secondary | ICD-10-CM | POA: Diagnosis not present

## 2022-08-22 DIAGNOSIS — H5213 Myopia, bilateral: Secondary | ICD-10-CM | POA: Diagnosis not present

## 2022-08-22 DIAGNOSIS — H0288A Meibomian gland dysfunction right eye, upper and lower eyelids: Secondary | ICD-10-CM | POA: Diagnosis not present

## 2022-08-22 DIAGNOSIS — H35033 Hypertensive retinopathy, bilateral: Secondary | ICD-10-CM | POA: Diagnosis not present

## 2022-08-22 DIAGNOSIS — H02831 Dermatochalasis of right upper eyelid: Secondary | ICD-10-CM | POA: Diagnosis not present

## 2022-09-02 ENCOUNTER — Encounter

## 2022-09-02 MED ORDER — AZITHROMYCIN 250 MG PO TABS
250 MG | ORAL_TABLET | ORAL | 0 refills | Status: AC
Start: 2022-09-02 — End: ?

## 2022-09-02 NOTE — Telephone Encounter (Signed)
Pt is needing a rx to be sent in to pharmacy for exacerbations

## 2022-09-06 MED ORDER — AZITHROMYCIN 250 MG PO TABS
250 MG | ORAL_TABLET | ORAL | 0 refills | Status: DC
Start: 2022-09-06 — End: 2023-01-15

## 2022-09-06 MED ORDER — PREDNISONE 10 MG PO TABS
10 MG | ORAL_TABLET | Freq: Every day | ORAL | 0 refills | Status: AC
Start: 2022-09-06 — End: 2022-09-11

## 2022-09-06 NOTE — Telephone Encounter (Signed)
Pt requesting steroid to be sent in (rec'd the ATB) to help with exacerbation. Thank you    Last Appt:  05/01/2022  Next Appt:   12/11/2022

## 2022-09-09 NOTE — Telephone Encounter (Signed)
Pt contacted and made aware.

## 2022-09-10 DIAGNOSIS — D696 Thrombocytopenia, unspecified: Secondary | ICD-10-CM | POA: Diagnosis not present

## 2022-10-30 DIAGNOSIS — I10 Essential (primary) hypertension: Secondary | ICD-10-CM | POA: Diagnosis not present

## 2022-10-30 DIAGNOSIS — K219 Gastro-esophageal reflux disease without esophagitis: Secondary | ICD-10-CM | POA: Diagnosis not present

## 2022-11-20 NOTE — Telephone Encounter (Signed)
Pt called in stating that her oxygen is dropping into the 60's, that she is having trouble getting it to even go into the 80's on 4L. Writer advised pt to go to ER to be evaluated, pt refused. Pt requested [facility] number to see what they could do about earlier appointment than her 12/11/2022 with Dr. [place]ble Creek. [facility] number provided to pt. Writer did try to urge patient to be evaluated.

## 2022-12-04 NOTE — Progress Notes (Signed)
Formatting of this note is different from the original.  [facility] Discharge Face-to-Face Office Visit     Subjective    Date of Discharge: 11/21/22 [facility])  [place] on 11/22/2022  Reason(s) for Admission:  Shortness of breaths      Shelly [name] presents today for follow-up of recent hospitalization at [facility]. She was discharged to Home  .  Patient was admitted on 11/20/2022 after she presented to the ER with low oxygen saturation.  She has a known history of emphysema and idiopathic pulmonary fibrosis.  She was initially started with antibiotics.  She was given prescription for Levaquin 5 days when she was discharged.  Was treated with Solu-Medrol IV in the hospital and converted to prednisone 40 mg daily for 5 days.  She was noted to have some anxiety and given prescription for Ativan for p.r.n. use.  She has not had use this.    She feels he is breathing back to her baseline.  She has appointment with her pulmonologist next week.    Medication reconciliation was completed today/discharge mediation list was reviewed with patient in detail.    Review of Systems   Constitutional:  Negative for chills and fever.   HENT:  Negative for congestion and rhinorrhea.    Respiratory:  Positive for shortness of breath and wheezing. Negative for cough.    Gastrointestinal:  Negative for nausea and vomiting.   Genitourinary:  Negative for difficulty urinating.   Neurological:  Positive for weakness. Negative for tremors, syncope and light-headedness.   Psychiatric/Behavioral:  Negative for agitation and dysphoric mood. The patient is not nervous/anxious.      Objective   BP 124/70   Pulse 76   Temp 97.9 F (36.6 C)   Ht 5\' 6"  (1.676 m)   Wt 165 lb (74.8 kg)   LMP  (LMP Unknown)   SpO2 90%   BMI 26.63 kg/m   BP Readings from Last 3 Encounters:   12/04/22 124/70   11/21/22 117/63   10/25/22 130/70     Wt Readings from Last 3 Encounters:   12/04/22 165 lb (74.8 kg)    11/21/22 164 lb 10.9 oz (74.7 kg)   10/25/22 168 lb 12.8 oz (76.6 kg)     General:  Alert in no obvious distress.    Psych:  Slightly anxious mood.  Normal affect.  Speech is clear and appropriate.    Lungs:  Fine inspiratory and expiratory crackles noted throughout.  No wheezing or consolidation noted.  No respiratory distress.  CV:  NSR.    Extremities:  No peripheral edema.  She has palpable posterior tibial, radial and ulnar pulses.  Diminished capillary refill.    Examination: XRAY CHEST 1 VIEW FRONTAL  Date of Exam: 11/20/2022 4:25 pm  Diagnosis/Reason for Exam: SOB; Panlobular emphysema (CMS/HCC)  Additional History: cough, SOB, hx pulmonary fibrosis  Number of Images: 1  Comparison: RAD - Chest - 2 Views - Frontal/Lateral dated 10/22/2022; RAD - Chest - 2 Views - Frontal/Lateral dated 07/15/2022; RAD - Chest - 2 Views - Frontal/Lateral dated 11/30/2020    DISCUSSION:  Lungs / pleura: Diffuse bilateral mixed interstitial and airspace heterogeneous opacities similar prior exam, basilar predominant in consistent with probable chronic lung disease/fibrosis.  Superimposed mild edema or pneumonitis not excluded.  No   definite new focal lung infiltrates.  No consolidation, pleural effusion or pneumothorax.  Right base nodular density on prior exam not visualized on current view.  Modest  symmetric inflation volumes.  Heart / mediastinum: Stable cardiomediastinal silhouette.  Top-normal heart size.  Calcified aortic arch.  Hilar structures / vascularity: Hilar vessels indistinct/obscured as before  Bones: Unremarkable for age with stable chronic senescent degenerative changes and osteopenia.  Additional: No significant interval change    IMPRESSION:   No significant interval change.  Similar relatively stable diffuse bilateral infiltrates, likely chronic fibrosis.  No definite superimposed acute cardiopulmonary abnormality    Assessment & Plan  IPF (idiopathic pulmonary fibrosis) (CMS/HCC)    Panlobular emphysema  (CMS/HCC)    Oxygen dependent    Patient will continue current medications.  She will continue prednisone 5 mg daily and discuss whether or not to continue with her pulmonologist next week.  She remains oxygen dependent.  She gets severely shortness breath without wearing oxygen.  She is going to pursue getting a motorized wheelchair for the home.  Even short distances with walking at home cause significant shortness breath even with oxygen.  She has to exert herself to propel a manual wheelchair and has found herself even short of breath doing this.  I do agree that this may help her in eating with doing ADLs for herself being functional at home and helping her to continue breathing comfortably    Last LACE Score: 2   interpreted as Low Risk of Readmission.    Medical decision making of this hospitalization was moderate complexity. Discharge medical record reviewed in detail with patient and all questions today were answered.   {:99}    Electronically signed by Ronnie Derby, MD at 12/04/2022 11:59 AM EST

## 2022-12-11 ENCOUNTER — Ambulatory Visit
Admit: 2022-12-11 | Discharge: 2022-12-11 | Payer: MEDICARE | Attending: Critical Care Medicine | Primary: Family Medicine

## 2022-12-11 DIAGNOSIS — J84112 Idiopathic pulmonary fibrosis: Secondary | ICD-10-CM

## 2022-12-11 MED ORDER — PREDNISONE 10 MG PO TABS
10 MG | ORAL_TABLET | Freq: Every day | ORAL | 4 refills | Status: AC
Start: 2022-12-11 — End: 2023-05-10

## 2022-12-11 NOTE — Progress Notes (Signed)
REASON FOR THE CONSULTATION:  IPF   HISTORY OF PRESENT ILLNESS:    Shelly [name] is a 77 y.o. year old female   Has worsening dyspnea . O2 sats 83 % with 3 l conserving device . Switched to continous o2 and sats 93 % on 3 l .   She felt better , peripheral cyanosis better .     Last visit   Continues to have dyspnea with ex which is slowly progressive , cough is present with sputum , no hemoptysis . Has home o2 but has 40 feet tubing so uses 3 l but does have dec sats . Uses POC with conserving device .       Last visit   here for evaluation of cough which has been present since before February 2021.  Patient correlates it with start of a bronchitis.  She took antibiotics and prednisone without any improvement.  Cough is barky.  She has tried inhaler albuterol without any significant improvement did not have long-acting beta agonist or ICS.  Also albuterol causes mouth sores she is does not like to use it.  Cough is present throughout the day but worse when she lays down at night does not complain of GERD.  She has sputum in the morning which is frothy.  Denies wheezing no hemoptysis no history of asthma.  Has been using cough syrup cough drops without any improvement.  Does have dyspnea with exertion.  Her mother may have had lung disease.  Patient does not have birds or lived on a farm from [id number].          PAST MEDICAL HISTORY:       Diagnosis Date    Skin cancer     basal cell-rt side upper lip         Family History:   History reviewed. No pertinent family history.    SURGICAL HISTORY:   Past Surgical History:   Procedure Laterality Date    KNEE ARTHROPLASTY Left     SHOULDER SURGERY Right     TONSILLECTOMY      TUBAL LIGATION             SOCIAL AND OCCUPATIONAL HEALTH:      There  No history of TB or TB exposure.    There  No asbestos ,[place]ilica dust exposure.    The patient reports  No coal mine, [place], [place], [employer] exposure.   History of travel outside the country No  There  is use of    marijuana No   VapingNo  IV heroinNo  Crack cocaineNo  There  Nohot tub exposure.   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 She has never used smokeless tobacco.  ETOH:   has no history on file for alcohol use.      ALLERGIES:      Allergies   Allergen Reactions    Atorvastatin Anaphylaxis    Adhesive Tape Other (See Comments)    Cephalexin Other (See Comments)    Nitrofurantoin Diarrhea and Nausea And Vomiting     intolerance         Home Meds:   Prior to Admission medications    Medication Sig Start Date End Date Taking? Authorizing Provider   acetaminophen (TYLENOL) 500 MG tablet Take 1 tablet by mouth every 6 hours as  needed   Yes [provider]   LORazepam (ATIVAN) 0.5 MG tablet TAKE 1 TABLET (0.5 MG) BY MOUTH EVERY 6 (SIX) HOURS AS NEEDED FOR ANXIETY FOR UP TO 5 DAYS. 11/21/22  Yes [provider]   potassium chloride (KLOR-CON M) 10 MEQ extended release tablet Take 1 tablet by mouth daily 11/22/22 12/22/22 Yes [provider]   predniSONE (DELTASONE) 10 MG tablet Take 1 tablet by mouth daily 12/11/22 05/10/23 Yes Caniya Tagle, Carmell Austria, MD   albuterol (PROVENTIL) (2.5 MG/3ML) 0.083% nebulizer solution Take 3 mLs by nebulization every 6 hours as needed for Wheezing 05/21/22  Yes Burman Riis, MD   NONFORMULARY Wild cherry bark and honey syrup   Yes [provider]   Cranberry 1000 MG CAPS Take by mouth   Yes [provider]   Omega-3 Fatty Acids (FISH OIL) 1000 MG CAPS Take 3 capsules by mouth 3 times daily   Yes [provider]   amLODIPine (NORVASC) 5 MG tablet Take 2 tablets by mouth daily 09/25/20 12/11/22 Yes [provider]   aspirin 81 MG EC tablet daily   Yes [provider]   hydroCHLOROthiazide (HYDRODIURIL) 25 MG tablet Take 1 tablet by mouth daily 11/12/19  Yes [provider]   vitamin E 1000 units capsule Take 1 capsule by  mouth daily   Yes [provider]   vitamin D 25 MCG (1000 UT) CAPS Take by mouth   Yes [provider]   azithromycin (ZITHROMAX Z-PAK) 250 MG tablet Azithromycin 250  mg po two tabs  first day Then 250 mg po daily one tab for next 4 days  Patient not taking: Reported on 12/11/2022 09/06/22   Burman Riis, MD   simvastatin (ZOCOR) 20 MG tablet Take 1 tablet by mouth nightly 05/15/20 05/01/22  [provider]            Review of Systems -  General ROS: negative for - chills, fatigue, fever or weight loss  ENT ROS: negative for - headaches, oral lesions or sore throat  Cardiovascular ROS: no chest pain , orthopnea or pnd   Gastrointestinal ROS: no abdominal pain, change in bowel habits, or black or bloody stools  Skin - no rash   Neuro - no blurry vision , no loc . No focal weakness   msk - no jt tenderness or swelling    Vascular - no claudication , rest completed and negative   Lymphatic - complete and negative   Hematology - oncology - complete and negative   Allergy immunology - complete and negative   GU no burning or hematuria    Vitals:  BP (!) 130/50 (Site: Right Upper Arm, Position: Sitting, Cuff Size: Large Adult)   Pulse 95   Temp 97.4 F (36.3 C)   Ht 1.676 m (5\' 6" )   Wt 74.4 kg (164 lb)   SpO2 (!) 83%   BMI 26.47 kg/m     PHYSICAL EXAM:  Head and neck atraumatic, normocephalic    Lymph nodes-no cervical, supraclavicular lymphadenopathy    Neck-no JVP elevation    Lungs - Ventilating all lobes without rhonchi, wheezes or dullness.  No bronchial breath sounds.  Chest expansion equal bilaterally, b/l velcro rales .    CVS- S1, S2 regular.  No S3 no S4, no murmurs    Abdomen-nontender, nondistended.  Bowel sounds are present.  No organomegaly    Lower extremity-no edema    Upper extremity-no edema    Neurological-grossly normal  cranial nerves.  No overt motor deficit                             IMPRESSION:     Diagnosis Orders   1. IPF (idiopathic pulmonary fibrosis) (HCC)   predniSONE (DELTASONE) 10 MG tablet    Echo (TTE) complete (PRN contrast/bubble/strain/3D)      2. Hypoxemic respiratory failure, chronic (HCC)  predniSONE (DELTASONE) 10 MG tablet    Echo (TTE) complete (PRN contrast/bubble/strain/3D)      3. UIP (usual interstitial pneumonitis) (HCC)  predniSONE (DELTASONE) 10 MG tablet    Echo (TTE) complete (PRN contrast/bubble/strain/3D)      4. Dyspnea on exertion  Echo (TTE) complete (PRN contrast/bubble/strain/3D)                  Declined OFEV and Pirfenidone   PLAN:      Extensive discussion with patient and her daughter . Again advised to use continous oxygen and not with conserving device . Shorten the home oxygen tubing . Increase prednisone to 10 mg po daily .  Px for motorized wheel chair - she has grad 4 dyspnea and unable to leave house due to dypnea .     Requested Prescriptions     Signed Prescriptions Disp Refills    predniSONE (DELTASONE) 10 MG tablet 30 tablet 4     Sig: Take 1 tablet by mouth daily       Medications Discontinued During This Encounter   Medication Reason    predniSONE (DELTASONE) 5 MG tablet DOSE ADJUSTMENT         Marcie Bal received counseling on the following healthy behaviors: nutrition, exercise and medication adherence    Patient given educational materials : see patient instruction       Discussed use, benefit, and side effects of prescribed medications.  Barriers to medication compliance addressed.      All patient questions answered.  Pt voiced understanding.   I hope this updates you on my evaluation and clinical thinking. Thank you for allowing me to participate in his care.     Sincerely,    Electronically signed by Burman Riis, MD on 12/13/2022 at 10:27 PM       Please note that this chart was generated using voice recognition Dragon dictation software.  Although every effort was made to ensure the accuracy of this automated transcription, some errors in transcription may have occurred.

## 2022-12-24 NOTE — Telephone Encounter (Signed)
Patient was seen in office on 12/11/22. A prescription for an electric wheelchair was given. Per [facility], per medicare guidelines: please see attached scan and please addend you last office visit. Thanks.    Last Appt:  12/11/2022  Next Appt:   01/15/2023  Med verified in Epic

## 2022-12-26 NOTE — Telephone Encounter (Signed)
Patient aware this has been deferred to PCP.

## 2023-01-15 ENCOUNTER — Ambulatory Visit
Admit: 2023-01-15 | Discharge: 2023-01-15 | Payer: MEDICARE | Attending: Critical Care Medicine | Primary: Family Medicine

## 2023-01-15 DIAGNOSIS — J84112 Idiopathic pulmonary fibrosis: Secondary | ICD-10-CM

## 2023-01-15 MED ORDER — PREDNISONE 10 MG PO TABS
10 | ORAL_TABLET | Freq: Every day | ORAL | 4 refills | Status: AC
Start: 2023-01-15 — End: 2023-06-14

## 2023-01-15 MED ORDER — AZITHROMYCIN 250 MG PO TABS
250 | ORAL_TABLET | ORAL | 0 refills | Status: AC
Start: 2023-01-15 — End: ?

## 2023-01-18 NOTE — Progress Notes (Signed)
REASON FOR THE CONSULTATION:  IPF   HISTORY OF PRESENT ILLNESS:    Shelly [name] is a 77 y.o. year old female   Worsening dyspnea , on home o2 but now up to 5 l and needs   Cough       Last visit   Continues to have dyspnea with ex which is slowly progressive , cough is present with sputum , no hemoptysis . Has home o2 but has 40 feet tubing so uses 3 l but does have dec sats . Uses POC with conserving device .       Last visit   here for evaluation of cough which has been present since before February 2021.  Patient correlates it with start of a bronchitis.  She took antibiotics and prednisone without any improvement.  Cough is barky.  She has tried inhaler albuterol without any significant improvement did not have long-acting beta agonist or ICS.  Also albuterol causes mouth sores she is does not like to use it.  Cough is present throughout the day but worse when she lays down at night does not complain of GERD.  She has sputum in the morning which is frothy.  Denies wheezing no hemoptysis no history of asthma.  Has been using cough syrup cough drops without any improvement.  Does have dyspnea with exertion.  Her mother may have had lung disease.  Patient does not have birds or lived on a farm from [id number].          PAST MEDICAL HISTORY:       Diagnosis Date    Skin cancer     basal cell-rt side upper lip         Family History:   History reviewed. No pertinent family history.    SURGICAL HISTORY:   Past Surgical History:   Procedure Laterality Date    KNEE ARTHROPLASTY Left     SHOULDER SURGERY Right     TONSILLECTOMY      TUBAL LIGATION             SOCIAL AND OCCUPATIONAL HEALTH:      There  No history of TB or TB exposure.    There  No asbestos ,[place]il[place] dust exposure.    The patient reports  No coal mine, [place], [place], [employer] exposure.   History of travel outside the country No  There  is use of   marijuana No   VapingNo  IV heroinNo  Crack cocaineNo  There  Nohot tub exposure.   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 She has never used smokeless tobacco.  ETOH:   has no history on file for alcohol use.      ALLERGIES:      Allergies   Allergen Reactions    Atorvastatin Anaphylaxis    Adhesive Tape Other (See Comments)    Cephalexin Other (See Comments)    Nitrofurantoin Diarrhea and Nausea And Vomiting     intolerance         Home Meds:   Prior to Admission medications    Medication Sig Start Date End Date Taking? Authorizing Provider   pantoprazole (PROTONIX) 40 MG tablet Take 1 tablet by mouth every morning (before breakfast) 11/22/22  Yes [provider]   azithromycin (ZITHROMAX Z-PAK) 250 MG tablet Azithromycin 250  mg po two tabs  first day Then 250 mg po daily one tab for next 4 days 01/15/23  Yes Danyelle Brookover, Carmell Austria, MD   predniSONE (DELTASONE) 10 MG tablet Take 1 tablet by mouth daily 01/15/23 06/14/23 Yes Jeimy Bickert, Carmell Austria, MD   acetaminophen (TYLENOL) 500 MG tablet Take 1 tablet by mouth every 6 hours as needed   Yes [provider]   LORazepam (ATIVAN) 0.5 MG tablet TAKE 1 TABLET (0.5 MG) BY MOUTH EVERY 6 (SIX) HOURS AS NEEDED FOR ANXIETY FOR UP TO 5 DAYS. 11/21/22  Yes [provider]   albuterol (PROVENTIL) (2.5 MG/3ML) 0.083% nebulizer solution Take 3 mLs by nebulization every 6 hours as needed for Wheezing 05/21/22  Yes Eleasha Cataldo, Carmell Austria, MD   Cranberry 1000 MG CAPS Take by mouth   Yes [provider]   Omega-3 Fatty Acids (FISH OIL) 1000 MG CAPS Take 3 capsules by mouth 3 times daily   Yes [provider]   aspirin 81 MG EC tablet daily   Yes [provider]   hydroCHLOROthiazide (HYDRODIURIL) 25 MG tablet Take 1 tablet by mouth daily 11/12/19  Yes [provider]   vitamin E 1000 units capsule Take 1 capsule by mouth daily   Yes [provider]   vitamin D 25 MCG (1000 UT) CAPS Take by mouth   Yes [provider]    potassium chloride (KLOR-CON M) 10 MEQ extended release tablet Take 1 tablet by mouth daily 11/22/22 12/22/22  [provider]   NONFORMULARY Wild cherry bark and honey syrup  Patient not taking: Reported on 01/15/2023    [provider]   amLODIPine (NORVASC) 5 MG tablet Take 2 tablets by mouth daily 09/25/20 12/11/22  [provider]   simvastatin (ZOCOR) 20 MG tablet Take 1 tablet by mouth nightly 05/15/20 05/01/22  [provider]            Review of Systems -  General ROS: negative for - chills, fatigue, fever or weight loss  ENT ROS: negative for - headaches, oral lesions or sore throat  Cardiovascular ROS: no chest pain , orthopnea or pnd   Gastrointestinal ROS: no abdominal pain, change in bowel habits, or black or bloody stools  Skin - no rash   Neuro - no blurry vision , no loc . No focal weakness   msk - no jt tenderness or swelling    Vascular - no claudication , rest completed and negative   Lymphatic - complete and negative   Hematology - oncology - complete and negative   Allergy immunology - complete and negative   GU no burning or hematuria    Vitals:  BP 138/72 (Site: Left Upper Arm, Position: Sitting, Cuff Size: Large Adult)   Pulse 94   Temp 97.5 F (36.4 C) (Temporal)   Resp 16   Ht 1.676 m (5' 6"$ )   Wt 73 kg (161 lb)   SpO2 (!) 88%   BMI 25.99 kg/m     PHYSICAL EXAM:  Head and neck atraumatic, normocephalic    Lymph nodes-no cervical, supraclavicular lymphadenopathy    Neck-no JVP elevation    Lungs - Ventilating all lobes without rhonchi, wheezes or dullness.  No bronchial breath sounds.  Chest expansion equal bilaterally, b/l velcro rales .    CVS- S1, S2 regular.  No S3 no S4, no murmurs    Abdomen-nontender, nondistended.  Bowel sounds are present.  No organomegaly    Lower extremity-no edema  Upper extremity-no edema    Neurological-grossly normal cranial nerves.  No overt motor deficit                             IMPRESSION:     Diagnosis Orders    1. IPF (idiopathic pulmonary fibrosis) (HCC)  azithromycin (ZITHROMAX Z-PAK) 250 MG tablet    predniSONE (DELTASONE) 10 MG tablet    DME Order for Home Oxygen as OP      2. Hypoxemic respiratory failure, chronic (HCC)  predniSONE (DELTASONE) 10 MG tablet    DME Order for Home Oxygen as OP      3. UIP (usual interstitial pneumonitis) (HCC)  azithromycin (ZITHROMAX Z-PAK) 250 MG tablet    predniSONE (DELTASONE) 10 MG tablet    DME Order for Home Oxygen as OP      4. Chronic cough                    Declined OFEV and Pirfenidone   PLAN:      Continue oxygen - will need concentrator to provide 10 l oxygen  Simple mask   Continue prednisone   Follow up 1 month     Requested Prescriptions     Signed Prescriptions Disp Refills    azithromycin (ZITHROMAX Z-PAK) 250 MG tablet 6 tablet 0     Sig: Azithromycin 250  mg po two tabs  first day Then 250 mg po daily one tab for next 4 days    predniSONE (DELTASONE) 10 MG tablet 30 tablet 4     Sig: Take 1 tablet by mouth daily       Medications Discontinued During This Encounter   Medication Reason    levoFLOXacin (LEVAQUIN) 500 MG tablet LIST CLEANUP    azithromycin (ZITHROMAX Z-PAK) 250 MG tablet REORDER    predniSONE (DELTASONE) 10 MG tablet REORDER         Marcie Bal received counseling on the following healthy behaviors: nutrition, exercise and medication adherence    Patient given educational materials : see patient instruction       Discussed use, benefit, and side effects of prescribed medications.  Barriers to medication compliance addressed.      All patient questions answered.  Pt voiced understanding.   I hope this updates you on my evaluation and clinical thinking. Thank you for allowing me to participate in his care.     Sincerely,    Electronically signed by Burman Riis, MD on 01/18/2023 at 6:31 PM       Please note that this chart was generated using voice recognition Dragon dictation software.  Although every effort was made to ensure the accuracy of this automated  transcription, some errors in transcription may have occurred.

## 2023-01-31 ENCOUNTER — Ambulatory Visit: Payer: MEDICARE | Primary: Family Medicine

## 2023-02-24 DEATH — deceased

## 2023-02-26 ENCOUNTER — Ambulatory Visit: Payer: MEDICARE | Primary: Family Medicine

## 2023-02-26 ENCOUNTER — Encounter: Payer: MEDICARE | Attending: Critical Care Medicine | Primary: Family Medicine
